# Patient Record
Sex: Female | Born: 1980
Health system: Southern US, Community
[De-identification: ages and names within clinical notes are randomized; demographics above are authoritative.]

## PROBLEM LIST (undated history)

## (undated) ENCOUNTER — Inpatient Hospital Stay (HOSPITAL_COMMUNITY): Payer: Self-pay

## (undated) DIAGNOSIS — IMO0002 Reserved for concepts with insufficient information to code with codable children: Secondary | ICD-10-CM

## (undated) DIAGNOSIS — R87629 Unspecified abnormal cytological findings in specimens from vagina: Secondary | ICD-10-CM

## (undated) DIAGNOSIS — O09299 Supervision of pregnancy with other poor reproductive or obstetric history, unspecified trimester: Secondary | ICD-10-CM

## (undated) DIAGNOSIS — R87619 Unspecified abnormal cytological findings in specimens from cervix uteri: Secondary | ICD-10-CM

## (undated) HISTORY — DX: Unspecified abnormal cytological findings in specimens from cervix uteri: R87.619

## (undated) HISTORY — PX: WISDOM TOOTH EXTRACTION: SHX21

## (undated) HISTORY — DX: Reserved for concepts with insufficient information to code with codable children: IMO0002

## (undated) HISTORY — DX: Unspecified abnormal cytological findings in specimens from vagina: R87.629

## (undated) HISTORY — PX: CRYOTHERAPY: SHX1416

---

## 2011-07-09 ENCOUNTER — Encounter: Payer: Self-pay | Admitting: Physician Assistant

## 2011-07-30 ENCOUNTER — Encounter: Payer: Self-pay | Admitting: Physician Assistant

## 2011-07-30 ENCOUNTER — Ambulatory Visit (INDEPENDENT_AMBULATORY_CARE_PROVIDER_SITE_OTHER): Payer: Self-pay | Admitting: Physician Assistant

## 2011-07-30 VITALS — BP 136/70 | HR 84 | Temp 98.0°F | Ht 61.0 in | Wt 186.0 lb

## 2011-07-30 DIAGNOSIS — Z30431 Encounter for routine checking of intrauterine contraceptive device: Secondary | ICD-10-CM

## 2011-07-30 MED ORDER — LORAZEPAM 1 MG PO TABS: ORAL_TABLET | ORAL | Status: DC

## 2011-07-30 MED ORDER — MISOPROSTOL 200 MCG PO TABS: ORAL_TABLET | ORAL | Status: DC

## 2011-07-30 NOTE — Patient Instructions (Signed)
Will check ultrasound for placement of IUD. If in the right spot, will plan for removal. Take cytotec in vagina night prior to removal and ativan tablet just before procedure for relaxation. Will need surgery if IUD is misplaced.

## 2011-07-30 NOTE — Progress Notes (Signed)
  Subjective:    Patient ID: Ashley Wilkerson, female    DOB: Apr 28, 1981, 31 y.o.   MRN: [id number]  HPI  Presents for IUD removal. Mirena placed 6 years ago. No pain or bleeding or other issues. Currently on OCPs for contraception. Two previous providers have not visualized strings. Last [place] was checked 2-3 years ago.   Review of Systems See HPI otherwise negative.    Objective:   Physical Exam  Constitutional: She is oriented to person, place, and time. She appears well-developed and well-nourished. No distress.  HENT:  Head: Normocephalic and atraumatic.  Genitourinary: Vagina normal and uterus normal.       Cervix visualized, no strings. Dilated and instrumented with curved kelleys, no return. Patient tolerated well. Procedure discontinued without visualization.  Musculoskeletal: She exhibits no edema.  Neurological: She is alert and oriented to person, place, and time. No cranial nerve deficit.  Psychiatric: She has a normal mood and affect.       Assessment & Plan:   IUD unable to be localized.  Will check [place] for placement. If normal, plan on pre-procedure cytotec and ativan for relaxation, with cervical block and removal. Discussed with patient if IUD is malpositioned may need operative removal.

## 2011-08-01 ENCOUNTER — Ambulatory Visit (HOSPITAL_COMMUNITY)
Admission: RE | Admit: 2011-08-01 | Discharge: 2011-08-01 | Disposition: A | Discharge status: Home / Self Care | Payer: Medicaid Other | Source: Ambulatory Visit | Attending: Obstetrics & Gynecology | Admitting: Obstetrics & Gynecology

## 2011-08-01 DIAGNOSIS — Z30431 Encounter for routine checking of intrauterine contraceptive device: Secondary | ICD-10-CM | POA: Insufficient documentation

## 2011-08-25 ENCOUNTER — Ambulatory Visit: Payer: Self-pay | Admitting: Physician Assistant

## 2014-04-03 ENCOUNTER — Encounter: Payer: Self-pay | Admitting: Physician Assistant

## 2014-09-21 ENCOUNTER — Other Ambulatory Visit (HOSPITAL_COMMUNITY)
Admission: RE | Admit: 2014-09-21 | Discharge: 2014-09-21 | Disposition: A | Discharge status: Home / Self Care | Payer: Medicaid Other | Source: Ambulatory Visit | Attending: Family Medicine | Admitting: Family Medicine

## 2014-09-21 DIAGNOSIS — Z1151 Encounter for screening for human papillomavirus (HPV): Secondary | ICD-10-CM | POA: Diagnosis present

## 2014-09-21 DIAGNOSIS — R87810 Cervical high risk human papillomavirus (HPV) DNA test positive: Secondary | ICD-10-CM | POA: Insufficient documentation

## 2014-09-22 ENCOUNTER — Other Ambulatory Visit (HOSPITAL_COMMUNITY)
Admission: RE | Admit: 2014-09-22 | Discharge: 2014-09-22 | Disposition: A | Discharge status: Home / Self Care | Payer: Medicaid Other | Source: Ambulatory Visit | Attending: Family Medicine | Admitting: Family Medicine

## 2014-09-22 DIAGNOSIS — Z01419 Encounter for gynecological examination (general) (routine) without abnormal findings: Secondary | ICD-10-CM | POA: Diagnosis not present

## 2014-09-22 DIAGNOSIS — Z1151 Encounter for screening for human papillomavirus (HPV): Secondary | ICD-10-CM | POA: Diagnosis present

## 2015-06-03 NOTE — L&D Delivery Note (Signed)
  After removal of cerclage cytotec 600 mcg buccal was given. At approximately 11:00 AM I was called to the room as infant's body had delivered. On exam the head remained inside the uterus and bleeding was minimal so delivery was expectant. At 11:47 I was called to the room by nursing with notification that the infant had delivered. Bleeding minimal. Cord clamped and cut and infant given to mother. Plan then for continued cytotec 400 mcg buccal q4 for management of 3rd stage. See future progress note for delivery of placenta.

## 2016-04-02 DIAGNOSIS — O09299 Supervision of pregnancy with other poor reproductive or obstetric history, unspecified trimester: Secondary | ICD-10-CM

## 2016-04-02 HISTORY — PX: CERVICAL CERCLAGE: SHX1329

## 2016-04-02 HISTORY — DX: Supervision of pregnancy with other poor reproductive or obstetric history, unspecified trimester: O09.299

## 2016-04-09 ENCOUNTER — Encounter: Payer: Self-pay | Admitting: Family Medicine

## 2016-04-09 ENCOUNTER — Ambulatory Visit (INDEPENDENT_AMBULATORY_CARE_PROVIDER_SITE_OTHER): Payer: Medicaid Other | Admitting: Family Medicine

## 2016-04-09 ENCOUNTER — Other Ambulatory Visit: Payer: Self-pay | Admitting: Family Medicine

## 2016-04-09 ENCOUNTER — Other Ambulatory Visit (HOSPITAL_COMMUNITY)
Admission: RE | Admit: 2016-04-09 | Discharge: 2016-04-09 | Disposition: A | Discharge status: Home / Self Care | Payer: Medicaid Other | Source: Ambulatory Visit | Attending: Family Medicine | Admitting: Family Medicine

## 2016-04-09 VITALS — BP 137/89 | HR 111 | Wt 197.0 lb

## 2016-04-09 DIAGNOSIS — O98312 Other infections with a predominantly sexual mode of transmission complicating pregnancy, second trimester: Secondary | ICD-10-CM | POA: Diagnosis not present

## 2016-04-09 DIAGNOSIS — O09529 Supervision of elderly multigravida, unspecified trimester: Secondary | ICD-10-CM | POA: Insufficient documentation

## 2016-04-09 DIAGNOSIS — O09522 Supervision of elderly multigravida, second trimester: Secondary | ICD-10-CM | POA: Diagnosis not present

## 2016-04-09 DIAGNOSIS — Z113 Encounter for screening for infections with a predominantly sexual mode of transmission: Secondary | ICD-10-CM

## 2016-04-09 DIAGNOSIS — A630 Anogenital (venereal) warts: Secondary | ICD-10-CM | POA: Insufficient documentation

## 2016-04-09 DIAGNOSIS — Z3689 Encounter for other specified antenatal screening: Secondary | ICD-10-CM

## 2016-04-09 DIAGNOSIS — Z1151 Encounter for screening for human papillomavirus (HPV): Secondary | ICD-10-CM | POA: Diagnosis present

## 2016-04-09 DIAGNOSIS — Z124 Encounter for screening for malignant neoplasm of cervix: Secondary | ICD-10-CM

## 2016-04-09 DIAGNOSIS — Z01419 Encounter for gynecological examination (general) (routine) without abnormal findings: Secondary | ICD-10-CM | POA: Insufficient documentation

## 2016-04-09 DIAGNOSIS — Z3480 Encounter for supervision of other normal pregnancy, unspecified trimester: Secondary | ICD-10-CM

## 2016-04-09 MED ORDER — IMIQUIMOD 5 % EX CREA: TOPICAL_CREAM | CUTANEOUS | 3 refills | Status: DC

## 2016-04-09 NOTE — Progress Notes (Signed)
Bedside ultrasound femur length 2.8 cm consistent with 18 weeks and 4 days. Fetal heart rate 168 bpm.Jennifer Howard RNBSN

## 2016-04-09 NOTE — Progress Notes (Signed)
  Subjective:    Ashley Wilkerson is a [id number] 5629w4d being seen today for her first obstetrical visit.  Her obstetrical history is significant for obesity. Patient does intend to breast feed. Pregnancy history fully reviewed.  Patient reports no complaints.  Vitals:   04/09/16 1544  BP: 137/89  Pulse: (!) 111  Weight: 197 lb (89.4 kg)    HISTORY: OB History  Gravida Para Term Preterm AB Living  3 2 2     2   SAB TAB Ectopic Multiple Live Births          1    # Outcome Date GA Lbr Len/2nd Weight Sex Delivery Anes PTL Lv  3 Current           2 Term 02/06/03 6164w0d  6 lb 4 oz (2.835 kg) F Vag-Spont  N LIV  1 Term 05/28/96 2767w0d  6 lb 8 oz (2.948 kg) M Vag-Spont  N      Past Medical History:  Diagnosis Date  . Abnormal Pap smear   . Vaginal Pap smear, abnormal    Past Surgical History:  Procedure Laterality Date  . cryo therapy    . CRYOTHERAPY     Family History  Problem Relation Age of Onset  . Hyperlipidemia Mother   . Hypertension Mother   . Cancer Neg Hx   . Birth defects Neg Hx      Exam    Uterus:  Fundal Height: 18 cm  Pelvic Exam:    Perineum: No Hemorrhoids, genital warts seen   Vulva: normal, Bartholin's, Urethra, Skene's normal   Vagina:  normal mucosa   Cervix: multiparous appearance  System:     Skin: normal coloration and turgor, no rashes    Neurologic: oriented, gait normal; reflexes normal and symmetric   Extremities: normal strength, tone, and muscle mass   HEENT PERRLA and extra ocular movement intact   Mouth/Teeth mucous membranes moist, pharynx normal without lesions   Neck supple and no masses   Cardiovascular: regular rate and rhythm, no murmurs or gallops   Respiratory:  appears well, vitals normal, no respiratory distress, acyanotic, normal RR, ear and throat exam is normal, neck free of mass or lymphadenopathy, chest clear, no wheezing, crepitations, rhonchi, normal symmetric air entry   Abdomen: soft, non-tender; bowel sounds  normal; no masses,  no organomegaly   Urinary: urethral meatus normal      Assessment:    Pregnancy: [id number] Patient Active Problem List   Diagnosis Date Noted  . Supervision of other normal pregnancy, antepartum 04/09/2016  . AMA (advanced maternal age) multigravida 35+, unspecified trimester 04/09/2016  . Maternal condyloma acuminata complicating pregnancy in second trimester 04/09/2016        Plan:     1. Supervision of other normal pregnancy, antepartum Discussed nature of practice. Harmony offered and ordered. US ordered.  100% of 30 minute visit spent in counseling. PNL ordered. - Cytology - PAP - Prenatal (OB Panel) - HIV antibody (with reflex) - CULTURE, URINE COMPREHENSIVE - Opiate, quantitative, urine - GC/Chlamydia probe amp ([facility] - US MFM OB COMP + 14 WK; Future  2. AMA (advanced maternal age) multigravida 35+, unspecified trimester Harmony offered - US MFM OB COMP + 14 WK; Future  3. Maternal condyloma acuminata complicating pregnancy in second trimester Aldara cream prescribed    Candelaria CelesteSTINSON, Zayne Marovich JEHIEL 04/09/2016

## 2016-04-10 LAB — OBSTETRIC PANEL
ANTIBODY SCREEN: NEGATIVE
BASOS PCT: 0 %
Basophils Absolute: 0 cells/uL (ref 0–200)
EOS PCT: 2 %
Eosinophils Absolute: 198 cells/uL (ref 15–500)
HEMATOCRIT: 42.6 % (ref 35.0–45.0)
HEMOGLOBIN: 13.9 g/dL (ref 11.7–15.5)
Hepatitis B Surface Ag: NEGATIVE
LYMPHS PCT: 24 %
Lymphs Abs: 2376 cells/uL (ref 850–3900)
MCH: 28.5 pg (ref 27.0–33.0)
MCHC: 32.6 g/dL (ref 32.0–36.0)
MCV: 87.5 fL (ref 80.0–100.0)
MONO ABS: 495 {cells}/uL (ref 200–950)
MPV: 11.4 fL (ref 7.5–12.5)
Monocytes Relative: 5 %
NEUTROS PCT: 69 %
Neutro Abs: 6831 cells/uL (ref 1500–7800)
PLATELETS: 221 10*3/uL (ref 140–400)
RBC: 4.87 MIL/uL (ref 3.80–5.10)
RDW: 14.3 % (ref 11.0–15.0)
RH TYPE: NEGATIVE
Rubella: 9.93 Index — ABNORMAL HIGH (ref <0.90)
WBC: 9.9 10*3/uL (ref 3.8–10.8)

## 2016-04-10 LAB — HIV ANTIBODY (ROUTINE TESTING W REFLEX): HIV: NONREACTIVE

## 2016-04-11 LAB — GC/CHLAMYDIA PROBE AMP (~~LOC~~) NOT AT ARMC
CHLAMYDIA, DNA PROBE: NEGATIVE
NEISSERIA GONORRHEA: NEGATIVE

## 2016-04-12 LAB — PAIN MGMT, OPIATES EXP. QN, UR
CODEINE: NEGATIVE ng/mL (ref <50)
Hydrocodone: NEGATIVE ng/mL (ref <50)
Hydromorphone: NEGATIVE ng/mL (ref <50)
Morphine: NEGATIVE ng/mL (ref <50)
Norhydrocodone: NEGATIVE ng/mL (ref <50)
Noroxycodone: NEGATIVE ng/mL (ref <50)
OXYCODONE: NEGATIVE ng/mL (ref <50)
OXYMORPHONE: NEGATIVE ng/mL (ref <50)

## 2016-04-12 LAB — CULTURE, URINE COMPREHENSIVE

## 2016-04-14 ENCOUNTER — Other Ambulatory Visit: Payer: Self-pay | Admitting: Family Medicine

## 2016-04-14 ENCOUNTER — Encounter: Payer: Self-pay | Admitting: Family Medicine

## 2016-04-14 ENCOUNTER — Ambulatory Visit (HOSPITAL_COMMUNITY)
Admission: RE | Admit: 2016-04-14 | Discharge: 2016-04-14 | Disposition: A | Discharge status: Home / Self Care | Payer: Medicaid Other | Source: Ambulatory Visit | Attending: Family Medicine | Admitting: Family Medicine

## 2016-04-14 DIAGNOSIS — O09529 Supervision of elderly multigravida, unspecified trimester: Secondary | ICD-10-CM

## 2016-04-14 DIAGNOSIS — Z3A19 19 weeks gestation of pregnancy: Secondary | ICD-10-CM | POA: Diagnosis not present

## 2016-04-14 DIAGNOSIS — Z3480 Encounter for supervision of other normal pregnancy, unspecified trimester: Secondary | ICD-10-CM

## 2016-04-14 DIAGNOSIS — O26899 Other specified pregnancy related conditions, unspecified trimester: Secondary | ICD-10-CM | POA: Insufficient documentation

## 2016-04-14 DIAGNOSIS — O09522 Supervision of elderly multigravida, second trimester: Secondary | ICD-10-CM | POA: Insufficient documentation

## 2016-04-14 DIAGNOSIS — Z363 Encounter for antenatal screening for malformations: Secondary | ICD-10-CM | POA: Diagnosis present

## 2016-04-14 DIAGNOSIS — Z6791 Unspecified blood type, Rh negative: Secondary | ICD-10-CM

## 2016-04-14 LAB — CYTOLOGY - PAP
DIAGNOSIS: NEGATIVE
HPV (WINDOPATH): DETECTED — AB

## 2016-04-16 ENCOUNTER — Encounter: Payer: Self-pay | Admitting: Family Medicine

## 2016-04-16 DIAGNOSIS — B977 Papillomavirus as the cause of diseases classified elsewhere: Secondary | ICD-10-CM | POA: Insufficient documentation

## 2016-04-17 ENCOUNTER — Encounter: Payer: Self-pay | Admitting: Family Medicine

## 2016-04-17 DIAGNOSIS — O3430 Maternal care for cervical incompetence, unspecified trimester: Secondary | ICD-10-CM | POA: Insufficient documentation

## 2016-04-20 ENCOUNTER — Encounter (HOSPITAL_COMMUNITY): Payer: Self-pay

## 2016-04-20 ENCOUNTER — Inpatient Hospital Stay (HOSPITAL_COMMUNITY)
Admission: AD | Admit: 2016-04-20 | Discharge: 2016-04-22 | DRG: 981 | Disposition: A | Discharge status: Home / Self Care | Payer: Medicaid Other | Source: Ambulatory Visit | Attending: Obstetrics & Gynecology | Admitting: Obstetrics & Gynecology

## 2016-04-20 ENCOUNTER — Other Ambulatory Visit: Payer: Self-pay | Admitting: Obstetrics and Gynecology

## 2016-04-20 ENCOUNTER — Inpatient Hospital Stay (HOSPITAL_COMMUNITY): Payer: Medicaid Other

## 2016-04-20 DIAGNOSIS — O42112 Preterm premature rupture of membranes, onset of labor more than 24 hours following rupture, second trimester: Secondary | ICD-10-CM | POA: Diagnosis not present

## 2016-04-20 DIAGNOSIS — O42912 Preterm premature rupture of membranes, unspecified as to length of time between rupture and onset of labor, second trimester: Secondary | ICD-10-CM | POA: Diagnosis present

## 2016-04-20 DIAGNOSIS — O42919 Preterm premature rupture of membranes, unspecified as to length of time between rupture and onset of labor, unspecified trimester: Secondary | ICD-10-CM | POA: Diagnosis not present

## 2016-04-20 DIAGNOSIS — Z8249 Family history of ischemic heart disease and other diseases of the circulatory system: Secondary | ICD-10-CM

## 2016-04-20 DIAGNOSIS — O3432 Maternal care for cervical incompetence, second trimester: Secondary | ICD-10-CM | POA: Diagnosis present

## 2016-04-20 DIAGNOSIS — Z3A20 20 weeks gestation of pregnancy: Secondary | ICD-10-CM

## 2016-04-20 LAB — WET PREP, GENITAL
Clue Cells Wet Prep HPF POC: NONE SEEN
SPERM: NONE SEEN
Trich, Wet Prep: NONE SEEN
Yeast Wet Prep HPF POC: NONE SEEN

## 2016-04-20 LAB — ABO/RH: ABO/RH(D): O NEG

## 2016-04-20 LAB — TYPE AND SCREEN
ABO/RH(D): O NEG
Antibody Screen: NEGATIVE

## 2016-04-20 LAB — URINALYSIS, ROUTINE W REFLEX MICROSCOPIC
Bilirubin Urine: NEGATIVE
Glucose, UA: NEGATIVE mg/dL
Ketones, ur: NEGATIVE mg/dL
Nitrite: NEGATIVE
PROTEIN: NEGATIVE mg/dL
Specific Gravity, Urine: 1.01 (ref 1.005–1.030)
pH: 8.5 — ABNORMAL HIGH (ref 5.0–8.0)

## 2016-04-20 LAB — CBC
HCT: 38.4 % (ref 36.0–46.0)
Hemoglobin: 13.1 g/dL (ref 12.0–15.0)
MCH: 28.5 pg (ref 26.0–34.0)
MCHC: 34.1 g/dL (ref 30.0–36.0)
MCV: 83.5 fL (ref 78.0–100.0)
PLATELETS: 202 10*3/uL (ref 150–400)
RBC: 4.6 MIL/uL (ref 3.87–5.11)
RDW: 13.6 % (ref 11.5–15.5)
WBC: 11.7 10*3/uL — AB (ref 4.0–10.5)

## 2016-04-20 LAB — URINE MICROSCOPIC-ADD ON: BACTERIA UA: NONE SEEN

## 2016-04-20 LAB — AMNISURE RUPTURE OF MEMBRANE (ROM) NOT AT ARMC: Amnisure ROM: POSITIVE

## 2016-04-20 MED ORDER — CALCIUM CARBONATE ANTACID 500 MG PO CHEW
2.0000 | CHEWABLE_TABLET | ORAL | Status: DC | PRN
  Administered 2016-04-21: 400 mg via ORAL
  Filled 2016-04-20: qty 2

## 2016-04-20 MED ORDER — LACTATED RINGERS IV SOLN
INTRAVENOUS | Status: DC
  Administered 2016-04-20 – 2016-04-22 (×3): via INTRAVENOUS

## 2016-04-20 MED ORDER — DOCUSATE SODIUM 100 MG PO CAPS
100.0000 mg | ORAL_CAPSULE | Freq: Every day | ORAL | Status: DC
  Administered 2016-04-21: 100 mg via ORAL
  Filled 2016-04-20: qty 1

## 2016-04-20 MED ORDER — ACETAMINOPHEN 325 MG PO TABS
650.0000 mg | ORAL_TABLET | ORAL | Status: DC | PRN
  Administered 2016-04-22: 650 mg via ORAL
  Filled 2016-04-20: qty 2

## 2016-04-20 MED ORDER — PRENATAL MULTIVITAMIN CH
1.0000 | ORAL_TABLET | Freq: Every day | ORAL | Status: DC
  Administered 2016-04-21: 1 via ORAL
  Filled 2016-04-20: qty 1

## 2016-04-20 MED ORDER — AZITHROMYCIN 250 MG PO TABS
1000.0000 mg | ORAL_TABLET | Freq: Once | ORAL | Status: AC
  Administered 2016-04-20: 1000 mg via ORAL
  Filled 2016-04-20: qty 4

## 2016-04-20 MED ORDER — HYDROMORPHONE HCL 1 MG/ML IJ SOLN
1.0000 mg | Freq: Once | INTRAMUSCULAR | Status: DC
  Filled 2016-04-20: qty 1

## 2016-04-20 MED ORDER — SODIUM CHLORIDE 0.9 % IV SOLN
2.0000 g | Freq: Four times a day (QID) | INTRAVENOUS | Status: DC
  Administered 2016-04-20 – 2016-04-22 (×7): 2 g via INTRAVENOUS
  Filled 2016-04-20 (×9): qty 2000

## 2016-04-20 MED ORDER — LORAZEPAM 2 MG/ML IJ SOLN
1.0000 mg | Freq: Once | INTRAMUSCULAR | Status: DC
  Filled 2016-04-20: qty 0.5

## 2016-04-20 MED ORDER — ZOLPIDEM TARTRATE 5 MG PO TABS: 5.0000 mg | ORAL_TABLET | Freq: Every evening | ORAL | Status: DC | PRN

## 2016-04-20 NOTE — MAU Note (Signed)
Pt presents with "something is coming out of her vagina". Pt states she had a [facility] ago [facility]. Pt denies any pain.

## 2016-04-20 NOTE — H&P (Signed)
History   [name] is a 35 yo G3P2002 @ 20.1 wks who presents with vaginal pressure after using bathroom this afternoon. Pt states she looked " with a mirror and saw a bulge." Denies any cramping or low back pain.  + FM       Chief Complaint  Patient presents with  . Abdominal Pain   HPI          OB History    Gravida Para Term Preterm AB Living   3 2 2     2    SAB TAB Ectopic Multiple Live Births           1          Past Medical History:  Diagnosis Date  . Abnormal Pap smear   . Vaginal Pap smear, abnormal          Past Surgical History:  Procedure Laterality Date  . cryo therapy    . CRYOTHERAPY           Family History  Problem Relation Age of Onset  . Hyperlipidemia Mother   . Hypertension Mother   . Cancer Neg Hx   . Birth defects Neg Hx         Social History  Substance Use Topics  . Smoking status: Never Smoker  . Smokeless tobacco: Never Used  . Alcohol use No    Allergies: No Known Allergies         Prescriptions Prior to Admission  Medication Sig Dispense Refill Last Dose  . imiquimod (ALDARA) 5 % cream Apply topically 3 (three) times a week. 12 each 3     Review of Systems  Constitutional: Negative.   HENT: Negative.   Eyes: Negative.   Respiratory: Negative.   Cardiovascular: Negative.   Gastrointestinal: Negative.   Genitourinary: Negative.   Musculoskeletal: Negative.   Skin: Negative.   Neurological: Negative.   Endo/Heme/Allergies: Negative.   Psychiatric/Behavioral: Negative.    Physical Exam   Blood pressure 136/76, pulse 107, temperature 98.1 F (36.7 C), temperature source Oral, resp. rate 18, last menstrual period 12/01/2015.  Physical Exam  Constitutional: She is oriented to person, place, and time. She appears well-developed and well-nourished.  HENT:  Head: Normocephalic and atraumatic.  Eyes: Conjunctivae are normal. Pupils are equal, round, and reactive to light.   Neck: Normal range of motion. Neck supple.  Cardiovascular: Normal rate and regular rhythm.   Respiratory: Effort normal and breath sounds normal.  GI: Soft. Bowel sounds are normal.  Genitourinary: Vaginal discharge found.  Genitourinary Comments: Mucous and bloody discharge noted on labia; upon internal examination membranes in vaginal vault   Musculoskeletal: Normal range of motion.  Neurological: She is alert and oriented to person, place, and time. She has normal reflexes.  Skin: Skin is warm and dry.  Psychiatric: She has a normal mood and affect. Her behavior is normal. Judgment and thought content normal.       MAU Course  Procedures     Assessment and Plan  1. Rupture of membranes @ 20.1 wks  2. Incompetent cervix  3. IUP @ 20.1 wks  4. AMA  Plan: Will obtain wet prep, GC/CMZ, and amnisure +; Will obtain u/s to confirm presentation, AFI, and possible cervical length; place pt in trendelenburg position; will consult w/ Dr. Penne LashLeggett about POC  Ashley Wilkerson 04/20/2016, 12:57 PM   Pt seen and examined.  Bulging bag approx 3 cm from the introitus.  US shows stitch still intact.  Amniosure  positive so ?high leak.   Discussed case with dr. Otho PerlNitsche.  Offered patient stitch removal and delivery due to risk of maternal infection and morbidity.  Pt can also elect to have leave stitch in place and monitor for signs of infection or labor.    At this point, the patient elects to keep stitch in place. Will start latency antibiotics and NICU consult.    Electronically signed by Lesly DukesKelly H Leggett, MD at 04/20/2016 4:21 PM

## 2016-04-20 NOTE — MAU Provider Note (Signed)
History   Ms. Barnabas ListerWashington is a 35 yo G3P2002 @ 20.1 wks who presents with vaginal pressure after using bathroom this afternoon. Pt states she looked " with a mirror and saw a bulge." Denies any cramping or low back pain.  + FM   CSN: [id number]  Arrival date and time: 04/20/16 1221   None     Chief Complaint  Patient presents with  . Abdominal Pain   HPI  OB History    Gravida Para Term Preterm AB Living   3 2 2     2    SAB TAB Ectopic Multiple Live Births           1      Past Medical History:  Diagnosis Date  . Abnormal Pap smear   . Vaginal Pap smear, abnormal     Past Surgical History:  Procedure Laterality Date  . cryo therapy    . CRYOTHERAPY      Family History  Problem Relation Age of Onset  . Hyperlipidemia Mother   . Hypertension Mother   . Cancer Neg Hx   . Birth defects Neg Hx     Social History  Substance Use Topics  . Smoking status: Never Smoker  . Smokeless tobacco: Never Used  . Alcohol use No    Allergies: No Known Allergies  Prescriptions Prior to Admission  Medication Sig Dispense Refill Last Dose  . imiquimod (ALDARA) 5 % cream Apply topically 3 (three) times a week. 12 each 3     Review of Systems  Constitutional: Negative.   HENT: Negative.   Eyes: Negative.   Respiratory: Negative.   Cardiovascular: Negative.   Gastrointestinal: Negative.   Genitourinary: Negative.   Musculoskeletal: Negative.   Skin: Negative.   Neurological: Negative.   Endo/Heme/Allergies: Negative.   Psychiatric/Behavioral: Negative.    Physical Exam   Blood pressure 136/76, pulse 107, temperature 98.1 F (36.7 C), temperature source Oral, resp. rate 18, last menstrual period 12/01/2015.  Physical Exam  Constitutional: She is oriented to person, place, and time. She appears well-developed and well-nourished.  HENT:  Head: Normocephalic and atraumatic.  Eyes: Conjunctivae are normal. Pupils are equal, round, and reactive to light.  Neck:  Normal range of motion. Neck supple.  Cardiovascular: Normal rate and regular rhythm.   Respiratory: Effort normal and breath sounds normal.  GI: Soft. Bowel sounds are normal.  Genitourinary: Vaginal discharge found.  Genitourinary Comments: Mucous and bloody discharge noted on labia; upon internal examination membranes in vaginal vault   Musculoskeletal: Normal range of motion.  Neurological: She is alert and oriented to person, place, and time. She has normal reflexes.  Skin: Skin is warm and dry.  Psychiatric: She has a normal mood and affect. Her behavior is normal. Judgment and thought content normal.       MAU Course  Procedures     Assessment and Plan  1. Rupture of membranes @ 20.1 wks  2. Incompetent cervix  3. IUP @ 20.1 wks  4. AMA  Plan: Will obtain wet prep, GC/CMZ, and amnisure +; Will obtain u/s to confirm presentation, AFI, and possible cervical length; place pt in trendelenburg position; will consult w/ Dr. Penne LashLeggett about POC  Wyvonnia DuskyMarie Lawson 04/20/2016, 12:57 PM   Pt seen and examined.  Bulging bag approx 3 cm from the introitus.  US shows stitch still intact.  Amniosure positive so ?high leak.   Discussed case with dr. Otho PerlNitsche.  Offered patient stitch removal and delivery due to risk  of maternal infection and morbidity.  Pt can also elect to have leave stitch in place and monitor for signs of infection or labor.    At this point, the patient elects to keep stitch in place. Will start latency antibiotics and NICU consult.

## 2016-04-20 NOTE — Progress Notes (Signed)
RN spoke with pt at shift change, and pt stated now indecisive about clipping cerclage and possible outcomes. MD notified. Dr. Shawnie PonsPratt at bedside, discussing options for mom and fetus at this point. Options, risks and benefits for each discussed. Pt states better understanding of options. Pt denies questions/ concerns at this time.

## 2016-04-21 ENCOUNTER — Encounter: Payer: Self-pay | Admitting: Obstetrics and Gynecology

## 2016-04-21 LAB — CERVICOVAGINAL ANCILLARY ONLY
CHLAMYDIA, DNA PROBE: NEGATIVE
NEISSERIA GONORRHEA: NEGATIVE

## 2016-04-21 NOTE — Progress Notes (Signed)
Patient ID: Ashley Wilkerson, female   DOB: 30-Jan-1981, 35 y.o.   MRN: [id number] FACULTY PRACTICE ANTEPARTUM(COMPREHENSIVE) NOTE  Ashley Wilkerson is a 35 y.o. G3P2002 at 4092w2d by midtrimester ultrasound who is admitted for incompetent cervix with probable ROM.   Fetal presentation is cephalic. Length of Stay:  1  Days  Subjective: Still leaking fluid Patient reports the fetal movement as active. Patient reports uterine contraction  activity as none. Patient reports  vaginal bleeding as none. Patient describes fluid per vagina as Clear.  Vitals:  Blood pressure 103/62, pulse 84, temperature 98.3 F (36.8 C), temperature source Oral, resp. rate 18, height 5' (1.524 m), weight 196 lb (88.9 kg), last menstrual period 12/01/2015, SpO2 99 %. Physical Examination:  General appearance - alert, well appearing, and in no distress Chest - normal effort Abdomen - gravid, NT Fundal Height:  size equals dates Extremities: Homans sign is negative, no sign of DVT  Membranes:intact +FHR by doppler  Labs:  Results for orders placed or performed during the hospital encounter of 04/20/16 (from the past 24 hour(s))  Urinalysis, Routine w reflex microscopic (not at [facility])   Collection Time: 04/20/16 12:27 PM  Result Value Ref Range   Color, Urine YELLOW YELLOW   APPearance CLEAR CLEAR   Specific Gravity, Urine 1.010 1.005 - 1.030   pH 8.5 (H) 5.0 - 8.0   Glucose, UA NEGATIVE NEGATIVE mg/dL   Hgb urine dipstick MODERATE (A) NEGATIVE   Bilirubin Urine NEGATIVE NEGATIVE   Ketones, ur NEGATIVE NEGATIVE mg/dL   Protein, ur NEGATIVE NEGATIVE mg/dL   Nitrite NEGATIVE NEGATIVE   Leukocytes, UA MODERATE (A) NEGATIVE  Urine microscopic-add on   Collection Time: 04/20/16 12:27 PM  Result Value Ref Range   Squamous Epithelial / LPF 0-5 (A) NONE SEEN   WBC, UA 6-30 0 - 5 WBC/hpf   RBC / HPF 6-30 0 - 5 RBC/hpf   Bacteria, UA NONE SEEN NONE SEEN  ABO/Rh   Collection Time: 04/20/16 12:48 PM  Result  Value Ref Range   ABO/RH(D) O NEG   Wet prep, genital   Collection Time: 04/20/16 12:59 PM  Result Value Ref Range   Yeast Wet Prep HPF POC NONE SEEN NONE SEEN   Trich, Wet Prep NONE SEEN NONE SEEN   Clue Cells Wet Prep HPF POC NONE SEEN NONE SEEN   WBC, Wet Prep HPF POC FEW (A) NONE SEEN   Sperm NONE SEEN   Amnisure rupture [facility] ([facility]   Collection Time: 04/20/16  1:29 PM  Result Value Ref Range   Amnisure ROM POSITIVE   CBC on admission   Collection Time: 04/20/16  3:15 PM  Result Value Ref Range   WBC 11.7 (H) 4.0 - 10.5 K/uL   RBC 4.60 3.87 - 5.11 MIL/uL   Hemoglobin 13.1 12.0 - 15.0 g/dL   HCT 13.038.4 86.536.0 - 78.446.0 %   MCV 83.5 78.0 - 100.0 fL   MCH 28.5 26.0 - 34.0 pg   MCHC 34.1 30.0 - 36.0 g/dL   RDW 69.613.6 29.511.5 - 28.415.5 %   Platelets 202 150 - 400 K/uL  Type and screen [facility]   Collection Time: 04/20/16  3:15 PM  Result Value Ref Range   ABO/RH(D) O NEG    Antibody Screen NEG    Sample Expiration 04/23/2016    Medications:  Scheduled . ampicillin (OMNIPEN) IV  2 g Intravenous Q6H  . docusate sodium  100 mg Oral Daily  .  HYDROmorphone (DILAUDID) injection  1 mg Intravenous Once  . LORazepam  1 mg Intravenous Once  . prenatal multivitamin  1 tablet Oral Q1200   I have reviewed the patient's current medications.  ASSESSMENT: Active Problems:   Preterm premature rupture of membranes (PPROM) with unknown onset of labor   PLAN: Delivery with s/sx's of infection Cerclage remains in place  Reva Boresanya S Pratt, MD 04/21/2016,7:26 AM

## 2016-04-22 ENCOUNTER — Encounter (HOSPITAL_COMMUNITY): Payer: Self-pay

## 2016-04-22 ENCOUNTER — Ambulatory Visit (HOSPITAL_COMMUNITY)
Admission: RE | Admit: 2016-04-22 | Payer: Medicaid Other | Source: Ambulatory Visit | Attending: Obstetrics and Gynecology | Admitting: Obstetrics and Gynecology

## 2016-04-22 DIAGNOSIS — O42112 Preterm premature rupture of membranes, onset of labor more than 24 hours following rupture, second trimester: Secondary | ICD-10-CM

## 2016-04-22 DIAGNOSIS — Z3A20 20 weeks gestation of pregnancy: Secondary | ICD-10-CM

## 2016-04-22 DIAGNOSIS — O3432 Maternal care for cervical incompetence, second trimester: Secondary | ICD-10-CM

## 2016-04-22 MED ORDER — MISOPROSTOL 200 MCG PO TABS
600.0000 ug | ORAL_TABLET | Freq: Once | ORAL | Status: AC
  Administered 2016-04-22: 600 ug via BUCCAL
  Filled 2016-04-22: qty 3

## 2016-04-22 MED ORDER — RHO D IMMUNE GLOBULIN 1500 UNIT/2ML IJ SOSY
300.0000 ug | PREFILLED_SYRINGE | Freq: Once | INTRAMUSCULAR | Status: AC
  Administered 2016-04-22: 300 ug via INTRAMUSCULAR
  Filled 2016-04-22: qty 2

## 2016-04-22 MED ORDER — ACETAMINOPHEN 325 MG PO TABS: 650.0000 mg | ORAL_TABLET | ORAL | 1 refills | Status: DC | PRN

## 2016-04-22 MED ORDER — MISOPROSTOL 200 MCG PO TABS
400.0000 ug | ORAL_TABLET | ORAL | Status: DC
  Administered 2016-04-22: 400 ug via BUCCAL
  Filled 2016-04-22: qty 2

## 2016-04-22 MED ORDER — IBUPROFEN 600 MG PO TABS: 600.0000 mg | ORAL_TABLET | Freq: Four times a day (QID) | ORAL | Status: DC | PRN

## 2016-04-22 MED ORDER — IBUPROFEN 600 MG PO TABS: 600.0000 mg | ORAL_TABLET | Freq: Four times a day (QID) | ORAL | 0 refills | Status: DC | PRN

## 2016-04-22 MED ORDER — AZITHROMYCIN 500 MG PO TABS
1000.0000 mg | ORAL_TABLET | Freq: Once | ORAL | Status: AC
  Administered 2016-04-22: 1000 mg via ORAL
  Filled 2016-04-22: qty 2

## 2016-04-22 NOTE — Progress Notes (Signed)
I met pt and family briefly this morning before the cerclage was removed.  They did not wish to speak at that time.  I checked in with her nurse later and she told me that they would let me know if they wanted to speak more.  I did see pt's mother, Army Melia, as she was leaving the room and offered her grief support which she was appreciative of.    Please page as needs arise.  [place], [place] Pager, [phone] 12:28 PM

## 2016-04-22 NOTE — Progress Notes (Signed)
Ashley Wilkerson is a 35 y.o. [id number] at 5537w3d admitted for PPROM and cervical incompetence. PPROM on 11/19, cerclage in place.  Patient had large amount of fluid per vagina today, has decided to proceed with cerclage removal and induction of labor due to previable PPROM.  Subjective: Patient is appropriately emotional.  Supported by her mother and FOB.  Declines to speak to Chaplain for now.  Objective: BP 117/79   Pulse 100   Temp 98.6 F (37 C) (Oral)   Resp 18   Ht 5' (1.524 m)   Wt 196 lb (88.9 kg)   LMP 12/01/2015   SpO2 98%   BMI 38.28 kg/m  I/O last 3 completed shifts: In: 1420 [P.O.:720; I.V.:600; IV Piggyback:100] Out: 2125 [Urine:2125] No intake/output data recorded. Gen: NAD Abd: NT, gravid UC:   irregular SSE/Cerclage removal:  Umbilical cord noted at introitus. On speculum exam, rest of cord and fetal foot noted to be coming out of cervix. Cervix visually 2-3 cm dilated.  Cerclage knot noted anteriorly; knot was grasped and one of the ends under the knot was cut. Cerclage was then removed intact. There was some bleeding after cerclage removal.     Labs: Lab Results  Component Value Date   WBC 11.7 (H) 04/20/2016   HGB 13.1 04/20/2016   HCT 38.4 04/20/2016   MCV 83.5 04/20/2016   PLT 202 04/20/2016    Assessment / Plan: Induction of labor due to PPROM. Will proceed with Cytotec 600 mcg then 400 mcg q 4h as needed Patient was informed of possible need for D&E for retained placenta or hemorrhage, will limit intake to clears for now.  Appropriate support given to patient and her family.   Tereso NewcomerANYANWU,Garwood Wentzell A, MD 04/22/2016, 9:43 AM

## 2016-04-22 NOTE — Lactation Note (Signed)
Lactation Consultation Note  Patient Name: Ashley Wilkerson [id number] Date: 04/22/2016   I met w/Mom and provided her w/the Breast Care After Loss brochure. We discussed cabbage leaves, sage tea & other comfort measures. Hand pump provided so that Mom can express if she begins to become uncomfortably full. Mom understands that the pump is not to be used to empty her breasts, but to express some milk for her comfort. Mom was shown how to use the hand pump; on visual inspection a size 24 flange is appropriate for her.    S/sx of mastitis discussed. Mom knows how to get in touch w/Lactation if she has further questions once she gets home.  Matthias Hughs [facility] 04/22/2016, 6:28 PM

## 2016-04-22 NOTE — Progress Notes (Signed)
I spent some time with pt's cousin who was sitting outside her room in the hallway.  I offered her a more private area where she and family could wait if needed.  I also gave her information for [employer], for Shequila's 35 year old daughter, that she could share with Merina at the right time.  She asked me some preliminary questions about making arrangements and I let her know that we would provide Alveta a list of funeral homes in a little while and gave her some information on how a family goes about making those arrangements.      Chaplain Dyanne CarrelKaty Jolie Strohecker, [place] Pager, [phone] 1:01 PM    04/22/16 1200  Clinical Encounter Type  Visited With Family;Patient and family together  Visit Type Initial;Spiritual support  Referral From Nurse;Physician  Spiritual Encounters  Spiritual Needs Grief support

## 2016-04-22 NOTE — Progress Notes (Signed)
Faculty Practice OB/GYN Attending Note   Subjective:  Called to evaluate patient with temperature of 100.3, retained placenta after delivery of nonviable fetus at 1147.  Patient has minimal bleeding, periodic cramping.   Admitted on 04/20/2016 for previable PPROM.      Objective:  Blood pressure 117/79, pulse 100, temperature 100.3 F (37.9 C), temperature source Oral, resp. rate 18, height 5' (1.524 m), weight 196 lb (88.9 kg), last menstrual period 12/01/2015, SpO2 98 %. Gen: NAD HENT: Normocephalic, atraumatic Lungs: Normal respiratory effort Heart: Regular rate noted Abdomen: NT, soft Ext: 2+ DTRs, no edema, no cyanosis, negative Homan's sign Cervix: Bulk of placenta palpated in vagina. Patient instructed to push once, and placenta delivered intact. Minimal bleeding noted after delivery.  Placenta sent to pathology.   Assessment & Plan:  35 y.o. [id number] s/p preterm delivery of previable 2960w3d fetus after PPROM.  Placenta has now delivered, patient has low grade temperature. Azithromycin 1000 mg po x 1 dose and Ibuprofen 600 mg given. Will continue close observation. If she remains afebrile and bleeding is still minimal and has no other concerning symptoms, she may be discharged later today. She was also given the option of going home tomorrow.   Jaynie CollinsUGONNA  Jaramie Bastos, MD, FACOG Attending Obstetrician & Gynecologist Faculty Practice, [facility]

## 2016-04-22 NOTE — Progress Notes (Signed)
Ashley Wilkerson is a 35 y.o. [id number] at 7714w3d admitted for rupture of membranes  Subjective: Discussed option with patient - had bigger gush of fluid last night. Patient would like cerclage removed and actively move to deliver the baby.  Objective: BP 117/79   Pulse 100   Temp 97.9 F (36.6 C) (Oral)   Resp 18   Ht 5' (1.524 m)   Wt 196 lb (88.9 kg)   LMP 12/01/2015   SpO2 98%   BMI 38.28 kg/m  I/O last 3 completed shifts: In: 1420 [P.O.:720; I.V.:600; IV Piggyback:100] Out: 2125 [Urine:2125] No intake/output data recorded.  Gen: A&Ox3. NAD Abd: soft, nontender. Ext: No edema.  Labs: Lab Results  Component Value Date   WBC 11.7 (H) 04/20/2016   HGB 13.1 04/20/2016   HCT 38.4 04/20/2016   MCV 83.5 04/20/2016   PLT 202 04/20/2016    Assessment / Plan: Due to risks of maternal infection and prognosis of pregnancy, I believe that the patient should be delivered emergently. Will move pt to birthing suite and start induction of labor with cytotec.   Alyene Predmore JEHIEL 04/22/2016, 7:10 AM

## 2016-04-22 NOTE — Discharge Summary (Signed)
OB Discharge Summary     Patient Name: Ashley Wilkerson DOB: 11/03/1980 MRN: [id number]  Date of admission: 04/20/2016 Delivering MD: Devra DoppKNOX, AMBER M   Date of discharge: 04/22/2016  Admitting diagnosis: 20 WKS, FEELS LIKE SOMETHING COMING OUT Intrauterine pregnancy: 7714w3d     Secondary diagnosis:  Cervical insufficiency, mid-trimester pprom, pregnancy termination Additional problems: none     Discharge diagnosis: delivery of non-viable infant after pre-viable pprom                                                                                                Antepartum procedures: cerclage removal  Postpartum procedures: Rhogam  Augmentation: Cytotec  [facility]  Hospital course:  The patient presented 11/19 with PPROM in the setting of known cervical insufficiency with vaginal cervical cerclage in place. The patient initially opted for expectant management. Betamethasone was given, and latency antibiotics started. On 11/21 the patient opted to have the cerclage removed and to proceed with pregnancy termination. Prolapsed cord and infant fetal feet noticed at time of cerclage removal. Buccal cytotec was administered. The patient proceeded to delivery of non-viable infant within approximately 2 hours, and the placenta passed approximately 3 hours later with gentle manual traction. The patient developed temperature of 100.3 around the time of delivery and was given azithromycin 1000 mg once. At the time of discharge she is afebrile, bleeding minimal, no nausea or vomiting, and with minimal uterine tenderness. The patient declined autopsy.  Physical exam  Vitals:   04/22/16 1639 04/22/16 1724 04/22/16 1800 04/22/16 1850  BP:   101/70   Pulse:   98   Resp:   18   Temp: 99.9 F (37.7 C) 99.5 F (37.5 C)  98.5 F (36.9 C)  TempSrc: Oral Oral  Oral  SpO2:      Weight:      Height:       General: alert, cooperative and no distress Lochia: appropriate Uterine  Fundus: firm Incision: N/A DVT Evaluation: No evidence of DVT seen on physical exam. Labs: Lab Results  Component Value Date   WBC 11.7 (H) 04/20/2016   HGB 13.1 04/20/2016   HCT 38.4 04/20/2016   MCV 83.5 04/20/2016   PLT 202 04/20/2016   No flowsheet data found.  Discharge instruction: per After Visit Summary and "Baby and Me Booklet".  After visit meds:    Medication List    STOP taking these medications   prenatal multivitamin Tabs tablet     TAKE these medications   acetaminophen 325 MG tablet Commonly known as:  TYLENOL Take 2 tablets (650 mg total) by mouth every 4 (four) hours as needed (for pain scale < 4  OR  temperature  >/=  100.5 F).   ibuprofen 600 MG tablet Commonly known as:  ADVIL,MOTRIN Take 1 tablet (600 mg total) by mouth every 6 (six) hours as needed for fever or headache.       Diet: carb modified diet  Activity: Advance as tolerated. Pelvic rest for 6 weeks.   Outpatient follow up:6 weeks Follow up Appt:Future Appointments Date Time Provider Department Center  05/07/2016 3:15 PM Levie HeritageJacob J Stinson, DO [facility] None   Follow up Visit:No Follow-up on file.  Postpartum contraception: undecided  Newborn Data: Stillborn female  Birth Weight: 0 oz (0 g) APGAR: 0, 0    04/22/2016 Silvano BilisNoah B Deshannon Seide, MD

## 2016-04-22 NOTE — Lactation Note (Signed)
Lactation Consultation Note  Patient Name: Rosaland LaoShareka Washington JXBJY'NToday's Date: 04/22/2016  Opened in error.  Lurline HareRichey, [place] [facility] 04/22/2016, 6:27 PM

## 2016-04-23 LAB — RH IG WORKUP (INCLUDES ABO/RH)
ABO/RH(D): O NEG
Antibody Screen: NEGATIVE
Fetal Screen: NEGATIVE
GESTATIONAL AGE(WKS): 20
UNIT DIVISION: 0

## 2016-04-23 NOTE — Progress Notes (Signed)
D/C paperwork reviewed with patient. No questions at this time. VS stable, no c/o pain. Patient escorted to vehicle via wheelchair with RN and mother.

## 2016-05-07 ENCOUNTER — Encounter: Payer: Medicaid Other | Admitting: Family Medicine

## 2017-02-08 ENCOUNTER — Encounter: Payer: Self-pay | Admitting: *Deleted

## 2017-02-08 DIAGNOSIS — O0990 Supervision of high risk pregnancy, unspecified, unspecified trimester: Secondary | ICD-10-CM | POA: Insufficient documentation

## 2017-02-10 ENCOUNTER — Encounter: Payer: Self-pay | Admitting: Obstetrics and Gynecology

## 2017-02-10 ENCOUNTER — Other Ambulatory Visit (HOSPITAL_COMMUNITY)
Admission: RE | Admit: 2017-02-10 | Discharge: 2017-02-10 | Disposition: A | Discharge status: Home / Self Care | Payer: Medicaid Other | Source: Ambulatory Visit | Attending: Obstetrics and Gynecology | Admitting: Obstetrics and Gynecology

## 2017-02-10 ENCOUNTER — Ambulatory Visit (INDEPENDENT_AMBULATORY_CARE_PROVIDER_SITE_OTHER): Payer: Medicaid Other | Admitting: Obstetrics and Gynecology

## 2017-02-10 VITALS — BP 118/78 | HR 72 | Temp 98.5°F | Wt 210.4 lb

## 2017-02-10 DIAGNOSIS — O26899 Other specified pregnancy related conditions, unspecified trimester: Secondary | ICD-10-CM

## 2017-02-10 DIAGNOSIS — O99211 Obesity complicating pregnancy, first trimester: Secondary | ICD-10-CM

## 2017-02-10 DIAGNOSIS — O09299 Supervision of pregnancy with other poor reproductive or obstetric history, unspecified trimester: Secondary | ICD-10-CM

## 2017-02-10 DIAGNOSIS — O99210 Obesity complicating pregnancy, unspecified trimester: Secondary | ICD-10-CM

## 2017-02-10 DIAGNOSIS — Z3689 Encounter for other specified antenatal screening: Secondary | ICD-10-CM

## 2017-02-10 DIAGNOSIS — Z3A00 Weeks of gestation of pregnancy not specified: Secondary | ICD-10-CM | POA: Diagnosis not present

## 2017-02-10 DIAGNOSIS — E669 Obesity, unspecified: Secondary | ICD-10-CM

## 2017-02-10 DIAGNOSIS — Z6791 Unspecified blood type, Rh negative: Secondary | ICD-10-CM

## 2017-02-10 DIAGNOSIS — O09891 Supervision of other high risk pregnancies, first trimester: Secondary | ICD-10-CM

## 2017-02-10 DIAGNOSIS — O0990 Supervision of high risk pregnancy, unspecified, unspecified trimester: Secondary | ICD-10-CM | POA: Diagnosis not present

## 2017-02-10 DIAGNOSIS — O09521 Supervision of elderly multigravida, first trimester: Secondary | ICD-10-CM

## 2017-02-10 DIAGNOSIS — O09899 Supervision of other high risk pregnancies, unspecified trimester: Secondary | ICD-10-CM

## 2017-02-10 DIAGNOSIS — O09291 Supervision of pregnancy with other poor reproductive or obstetric history, first trimester: Secondary | ICD-10-CM

## 2017-02-10 DIAGNOSIS — O09529 Supervision of elderly multigravida, unspecified trimester: Secondary | ICD-10-CM

## 2017-02-10 LAB — OB RESULTS CONSOLE GC/CHLAMYDIA: GC PROBE AMP, GENITAL: NEGATIVE

## 2017-02-10 NOTE — Progress Notes (Signed)
New OB Note  02/10/2017   [facility]  Chief Complaint: NOB  Transfer of Care Patient: no  History of Present Illness: [name] is a 36 y.o. [id number] @ 8/0 weeks (EDC 4/23, based on 8wk u/s). Patient's last menstrual period was 11/20/2016.  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   Any events prior to today's visit: no Her periods were: regular, monthly She was using no method when she conceived.  She has Positive signs or symptoms of nausea of pregnancy (mild) She has Negative signs or symptoms of miscarriage or preterm labor On any medications around the time she conceived/early pregnancy: No   ROS: A 12-point review of systems was performed and negative, except as stated in the above HPI.  OBGYN History: As per HPI. OB History  Gravida Para Term Preterm AB Living  4 3 2     2   SAB TAB Ectopic Multiple Live Births        0 1    # Outcome Date GA Lbr Len/2nd Weight Sex Delivery Anes PTL Lv  4 Current           3 Para 04/22/16 6035w3d  0 oz (0 kg) M Vag-Breech None  FD  2 Term 02/06/03 8812w0d  6 lb 4 oz (2.835 kg) F Vag-Spont  N LIV  1 Term 05/28/96 4012w0d  6 lb 8 oz (2.948 kg) M Vag-Spont  N       Any issues with any prior pregnancies: yes (see below) Prior children are healthy, doing well, and without any problems or issues: yes except for last pregnancy.  History of pap smears: Yes. Last pap smear 2017 and results were NILM/HPV pos   Past Medical History: Past Medical History:  Diagnosis Date   . Abnormal Pap smear   . Vaginal Pap smear, abnormal     Past Surgical History: Past Surgical History:  Procedure Laterality Date  . cryo therapy    . CRYOTHERAPY      Family History:  Family History  Problem Relation Age of Onset  . Hyperlipidemia Mother   . Hypertension Mother   . Cancer Neg Hx   . Birth defects Neg Hx    She denies any history of mental retardation, birth defects or genetic disorders in her or the FOB's history  Social History:  Social History   Social History  . Marital status: Single    Spouse name: N/A  . Number of children: N/A  . Years of education: N/A   Occupational History  . Not on file.   Social History Main Topics  . Smoking status: Never Smoker  . Smokeless tobacco: Never Used  . Alcohol use No  . Drug use: No  . Sexual activity: Yes    Birth control/ protection: Pill   Other Topics Concern  . Not on file   Social History Narrative  . No narrative on file    Allergy: No Known Allergies  Health Maintenance:  Mammogram Up to Date: not applicable  Current Outpatient Medications: PNV  Physical Exam:   BP 118/78   Pulse 72   Temp 98.5 F (36.9 C)  Wt 210 lb 6.4 oz (95.4 kg)   LMP 11/20/2016   BMI 41.09 kg/m  Body mass index is 41.09 kg/m. Contractions: Not present Vag. Bleeding: None. Fundal height: not applicable FHTs: 160s  General appearance: Well nourished, well developed female in no acute distress.  Neck:  Supple, normal appearance, and no thyromegaly  Cardiovascular: S1, S2 normal, no murmur, rub or gallop, regular rate and rhythm Respiratory:  Clear to auscultation bilateral. Normal respiratory effort Abdomen: positive bowel sounds and no masses, hernias; diffusely non tender to palpation, non distended Breasts: breasts appear normal, no suspicious masses, no skin or nipple changes or axillary nodes, and normal palpation. Neuro/Psych:  Normal mood and affect.  Skin:  Warm and dry.  Lymphatic:  No  inguinal lymphadenopathy.   Pelvic exam: is not limited by body habitus EGBUS: within normal limits, Vagina: within normal limits and with no blood in the vault, Cervix: normal appearing cervix without discharge or lesions, closed/long/high, Uterus:  nonenlarged, and Adnexa:  normal adnexa and no mass, fullness, tenderness  Laboratory: none  Imaging:  SLIUP c/w 8/0 weeks. FHR 160s  Assessment: pt doing well  Plan: 1. Supervision of high risk pregnancy, antepartum Routine care. Consider starting baby asa after 12wks - SMN1 Copy Number Analysis - [place] MFM Fetal Nuchal Translucency; Future - Obstetric Panel, Including HIV - Culture, OB Urine - Cytology - PAP - Hemoglobinopathy Evaluation - Cystic fibrosis gene test - TSH - Comprehensive metabolic panel - Protein / creatinine ratio, urine - [place] bedside; Future  2. Obesity (BMI 30-39.9) Will do early 2hr in 2wks when she comes back for cffdna - TSH - Comprehensive metabolic panel - Protein / creatinine ratio, urine  3. Obesity in pregnancy  4. Short interval between pregnancies affecting pregnancy in first trimester, antepartum  5. History of cervical incompetence in pregnancy, currently pregnant 04/2016: no cervix seen on routine anatomy u/s. 19wk rescue cerclage place. 20wk membranes in vault, pprom, cerclage removed and delivery  Options d/w pt: ppx cerclage at 14wks, serial CLs starting at 16wks and intervene if it appears to shorten or nothing. I told her that I'd recommend the 1st or 2nd option. Pt to decide and let us know at nv.   6. AMA (advanced maternal age) multigravida 35+, unspecified trimester cffdna nv.   7. Rh negative, antepartum Rhogam prn  Problem list reviewed and updated.  Follow up in 2 weeks.   >50% of 25 min visit spent on counseling and coordination of care.     Cornelia Copa MD Attending Center for [facility])

## 2017-02-10 NOTE — Progress Notes (Signed)
DATING AND VIABILITY SONOGRAM   Rosaland LaoShareka Washington is a 36 y.o. year old 434P2002 with LMP Patient's last menstrual period was 11/20/2016. which would correlate to  5263w0d weeks gestation.  She has regular menstrual cycles.   She is here today for a confirmatory initial sonogram.    GESTATION: SINGLETON     FETAL ACTIVITY:          Heart rate         166          The fetus is active.   GESTATIONAL AGE AND  BIOMETRICS:  Gestational criteria: Estimated Date of Delivery: 09/22/17 by early ultrasound now at 5163w0d  Previous Scans:0  CROWN RUMP LENGTH           1.63cm 5363w0d                                                    AVERAGE EGA(BY THIS SCAN):  6163w0d weeks  WORKING EDD( early ultrasound ):  09/22/17     TECHNICIAN COMMENTS:  SLIUP measuring 1363w0d CRL and FHR 166bpm   A copy of this report including all images has been saved and backed up to a second source for retrieval if needed. All measures and details of the anatomical scan, placentation, fluid volume and pelvic anatomy are contained in that report.  Keyandra Swenson 02/10/2017 9:53 AM

## 2017-02-11 LAB — PROTEIN / CREATININE RATIO, URINE
CREATININE, UR: 58.1 mg/dL
PROTEIN UR: 4.5 mg/dL
PROTEIN/CREAT RATIO: 77 mg/g{creat} (ref 0–200)

## 2017-02-12 LAB — HEMOGLOBINOPATHY EVALUATION
Ferritin: 38 ng/mL (ref 15–150)
HEMOGLOBIN: 14.2 g/dL (ref 11.1–15.9)
HGB A: 61.3 % — AB (ref 96.4–98.8)
HGB C: 0 %
HGB S: 34.4 % — AB
HGB VARIANT: 0 %
Hematocrit: 43.1 % (ref 34.0–46.6)
Hgb A2 Quant: 4.3 % — ABNORMAL HIGH (ref 1.8–3.2)
Hgb F Quant: 0 % (ref 0.0–2.0)
Hgb Solubility: POSITIVE — AB
MCH: 28.2 pg (ref 26.6–33.0)
MCHC: 32.9 g/dL (ref 31.5–35.7)
MCV: 86 fL (ref 79–97)
PLATELETS: 212 10*3/uL (ref 150–379)
RBC: 5.04 x10E6/uL (ref 3.77–5.28)
RDW: 14.7 % (ref 12.3–15.4)
WBC: 7.7 10*3/uL (ref 3.4–10.8)

## 2017-02-12 LAB — URINE CULTURE, OB REFLEX

## 2017-02-12 LAB — CULTURE, OB URINE

## 2017-02-13 ENCOUNTER — Encounter: Payer: Self-pay | Admitting: Obstetrics and Gynecology

## 2017-02-13 DIAGNOSIS — D573 Sickle-cell trait: Secondary | ICD-10-CM | POA: Insufficient documentation

## 2017-02-13 LAB — CYTOLOGY - PAP
Chlamydia: NEGATIVE
Diagnosis: NEGATIVE
HPV: NOT DETECTED
Neisseria Gonorrhea: NEGATIVE

## 2017-02-17 ENCOUNTER — Encounter: Payer: Self-pay | Admitting: *Deleted

## 2017-02-19 LAB — OBSTETRIC PANEL, INCLUDING HIV
ANTIBODY SCREEN: NEGATIVE
BASOS: 1 %
Basophils Absolute: 0 10*3/uL (ref 0.0–0.2)
EOS (ABSOLUTE): 0.1 10*3/uL (ref 0.0–0.4)
EOS: 1 %
HEMATOCRIT: 43.5 % (ref 34.0–46.6)
HEMOGLOBIN: 14.2 g/dL (ref 11.1–15.9)
HIV SCREEN 4TH GENERATION: NONREACTIVE
Hepatitis B Surface Ag: NEGATIVE
Immature Grans (Abs): 0 10*3/uL (ref 0.0–0.1)
Immature Granulocytes: 0 %
LYMPHS ABS: 2 10*3/uL (ref 0.7–3.1)
Lymphs: 25 %
MCH: 28 pg (ref 26.6–33.0)
MCHC: 32.6 g/dL (ref 31.5–35.7)
MCV: 86 fL (ref 79–97)
MONOS ABS: 0.6 10*3/uL (ref 0.1–0.9)
Monocytes: 7 %
Neutrophils Absolute: 5.4 10*3/uL (ref 1.4–7.0)
Neutrophils: 66 %
Platelets: 226 10*3/uL (ref 150–379)
RBC: 5.07 x10E6/uL (ref 3.77–5.28)
RDW: 15 % (ref 12.3–15.4)
RH TYPE: NEGATIVE
RPR Ser Ql: NONREACTIVE
Rubella Antibodies, IGG: 9.1 index (ref >0.99)
WBC: 8.1 10*3/uL (ref 3.4–10.8)

## 2017-02-19 LAB — COMPREHENSIVE METABOLIC PANEL
ALBUMIN: 4.5 g/dL (ref 3.5–5.5)
ALT: 12 IU/L (ref 0–32)
AST: 19 IU/L (ref 0–40)
Albumin/Globulin Ratio: 1.6 (ref 1.2–2.2)
Alkaline Phosphatase: 58 IU/L (ref 39–117)
BILIRUBIN TOTAL: 0.2 mg/dL (ref 0.0–1.2)
BUN/Creatinine Ratio: 14 (ref 9–23)
BUN: 10 mg/dL (ref 6–20)
CHLORIDE: 100 mmol/L (ref 96–106)
CO2: 16 mmol/L — ABNORMAL LOW (ref 20–29)
Calcium: 9.7 mg/dL (ref 8.7–10.2)
Creatinine, Ser: 0.73 mg/dL (ref 0.57–1.00)
GFR calc non Af Amer: 106 mL/min/{1.73_m2} (ref >59)
GFR, EST AFRICAN AMERICAN: 123 mL/min/{1.73_m2} (ref >59)
GLUCOSE: 85 mg/dL (ref 65–99)
Globulin, Total: 2.8 g/dL (ref 1.5–4.5)
Potassium: 3.7 mmol/L (ref 3.5–5.2)
Sodium: 137 mmol/L (ref 134–144)
TOTAL PROTEIN: 7.3 g/dL (ref 6.0–8.5)

## 2017-02-19 LAB — SMN1 COPY NUMBER ANALYSIS (SMA CARRIER SCREENING)

## 2017-02-19 LAB — TSH: TSH: 2.07 u[IU]/mL (ref 0.450–4.500)

## 2017-02-19 LAB — CYSTIC FIBROSIS GENE TEST

## 2017-02-24 ENCOUNTER — Encounter (HOSPITAL_COMMUNITY): Payer: Self-pay

## 2017-02-24 ENCOUNTER — Encounter: Payer: Self-pay | Admitting: Obstetrics & Gynecology

## 2017-02-24 ENCOUNTER — Ambulatory Visit (INDEPENDENT_AMBULATORY_CARE_PROVIDER_SITE_OTHER): Payer: Medicaid Other | Admitting: Obstetrics & Gynecology

## 2017-02-24 VITALS — BP 114/76 | HR 86 | Wt 208.0 lb

## 2017-02-24 DIAGNOSIS — O09299 Supervision of pregnancy with other poor reproductive or obstetric history, unspecified trimester: Secondary | ICD-10-CM

## 2017-02-24 DIAGNOSIS — O0990 Supervision of high risk pregnancy, unspecified, unspecified trimester: Secondary | ICD-10-CM

## 2017-02-24 DIAGNOSIS — O0991 Supervision of high risk pregnancy, unspecified, first trimester: Secondary | ICD-10-CM

## 2017-02-24 DIAGNOSIS — O09291 Supervision of pregnancy with other poor reproductive or obstetric history, first trimester: Secondary | ICD-10-CM

## 2017-02-24 NOTE — Patient Instructions (Signed)
Return to clinic for any scheduled appointments or for any gynecologic concerns as needed.   

## 2017-02-24 NOTE — Progress Notes (Signed)
   PRENATAL VISIT NOTE  Subjective:  Ashley Wilkerson is a 36 y.o. G4P2002 at [redacted]w[redacted]d being seen today for ongoing prenatal care.  She is currently monitored for the following issues for this high-risk pregnancy and has AMA (advanced maternal age) multigravida 35+, unspecified trimester; Maternal condyloma acuminata complicating pregnancy in second trimester; Rh negative status during pregnancy; HPV in female; Supervision of high risk pregnancy, antepartum; Short interval between pregnancies affecting pregnancy in first trimester, antepartum; History of cervical incompetence in pregnancy, currently pregnant; Obesity (BMI 30-39.9); Obesity in pregnancy; and Sickle cell trait (HCC) on her problem list.  Patient reports no complaints.  Contractions: Not present. Vag. Bleeding: None.  Movement: Absent. Denies leaking of fluid.   The following portions of the patient's history were reviewed and updated as appropriate: allergies, current medications, past family history, past medical history, past social history, past surgical history and problem list. Problem list updated.  Objective:   Vitals:   02/24/17 1112  BP: 114/76  Pulse: 86  Weight: 208 lb (94.3 kg)    Fetal Status: Fetal Heart Rate (bpm): 160 on u/s   Movement: Absent     General:  Alert, oriented and cooperative. Patient is in no acute distress.  Skin: Skin is warm and dry. No rash noted.   Cardiovascular: Normal heart rate noted  Respiratory: Normal respiratory effort, no problems with respiration noted  Abdomen: Soft, gravid, appropriate for gestational age.  Pain/Pressure: Absent     Pelvic: Cervical exam deferred        Extremities: Normal range of motion.  Edema: None  Mental Status:  Normal mood and affect. Normal behavior. Normal judgment and thought content.   Assessment and Plan:  Pregnancy: G4P2002 at [redacted]w[redacted]d  1. History of cervical incompetence in pregnancy, currently pregnant Discussed options of cervical cerclage at  13-14 weeks in combination with serial cervical length scans; or just scans alone.  She opted for cerclage + scans. Surgical order sent to [facility] for scheduling; preoperative TV scan added onto 03/17/17 already scheduled NT scan at [facility].  - [place] MFM OB Transvaginal; Future  2. Supervision of high risk pregnancy, antepartum NIPS and early 1 hr GTT done today, will follow up results and manage accordingly. - Genetic Screening - Glucose tolerance, 1 hour No other complaints or concerns.  Routine obstetric precautions reviewed. Please refer to After Visit Summary for other counseling recommendations.  Return in about 4 weeks (around 03/24/2017) for OB Visit.   Jaynie Collins, MD

## 2017-02-25 LAB — GLUCOSE TOLERANCE, 1 HOUR: Glucose, 1Hr PP: 116 mg/dL (ref 65–199)

## 2017-03-09 ENCOUNTER — Telehealth: Payer: Self-pay

## 2017-03-09 ENCOUNTER — Encounter (HOSPITAL_COMMUNITY): Payer: Self-pay | Admitting: Obstetrics and Gynecology

## 2017-03-09 NOTE — Telephone Encounter (Signed)
Call patient to let her know the results of her harmony prenatal testing. Its  A girl and negative for all other test.

## 2017-03-10 ENCOUNTER — Encounter: Payer: Self-pay | Admitting: Obstetrics & Gynecology

## 2017-03-12 NOTE — Patient Instructions (Addendum)
Your procedure is scheduled on:  Thursday, Oct. 25 at 11:30 am  Enter through the [employer] [facility] at: 10 am  Pick up the phone at the desk and dial [phone].  Call this number if you have problems the morning of surgery: [phone].  Remember: Do NOT eat food or drink clear liquids (including water) after midnight Wednesday, 10/24  Take these medicines the morning of surgery with a SIP OF WATER:  None  Do NOT wear jewelry (body piercing), metal hair clips/bobby pins, make-up, or nail polish. Do NOT wear lotions, powders, or perfumes.  You may wear deoderant. Do NOT shave for 48 hours prior to surgery. Do NOT bring valuables to the hospital. Contacts may not be worn into surgery.  Have a responsible adult drive you home and stay with you for 24 hours after your procedure.  Home with husband Ashley Wilkerson cell [phone].

## 2017-03-13 ENCOUNTER — Encounter (HOSPITAL_COMMUNITY): Payer: Self-pay

## 2017-03-16 ENCOUNTER — Encounter (HOSPITAL_COMMUNITY): Payer: Self-pay

## 2017-03-16 ENCOUNTER — Encounter (HOSPITAL_COMMUNITY)
Admission: RE | Admit: 2017-03-16 | Discharge: 2017-03-16 | Disposition: A | Discharge status: Home / Self Care | Payer: Medicaid Other | Source: Ambulatory Visit | Attending: Obstetrics & Gynecology | Admitting: Obstetrics & Gynecology

## 2017-03-16 DIAGNOSIS — Z01818 Encounter for other preprocedural examination: Secondary | ICD-10-CM | POA: Diagnosis present

## 2017-03-16 HISTORY — DX: Supervision of pregnancy with other poor reproductive or obstetric history, unspecified trimester: O09.299

## 2017-03-16 LAB — CBC
HCT: 40.1 % (ref 36.0–46.0)
HEMOGLOBIN: 13.9 g/dL (ref 12.0–15.0)
MCH: 29 pg (ref 26.0–34.0)
MCHC: 34.7 g/dL (ref 30.0–36.0)
MCV: 83.7 fL (ref 78.0–100.0)
PLATELETS: 202 10*3/uL (ref 150–400)
RBC: 4.79 MIL/uL (ref 3.87–5.11)
RDW: 13.8 % (ref 11.5–15.5)
WBC: 9.7 10*3/uL (ref 4.0–10.5)

## 2017-03-17 ENCOUNTER — Other Ambulatory Visit: Payer: Self-pay | Admitting: Obstetrics and Gynecology

## 2017-03-17 ENCOUNTER — Ambulatory Visit (HOSPITAL_COMMUNITY)
Admission: RE | Admit: 2017-03-17 | Discharge: 2017-03-17 | Disposition: A | Discharge status: Home / Self Care | Payer: Medicaid Other | Source: Ambulatory Visit | Attending: Obstetrics and Gynecology | Admitting: Obstetrics and Gynecology

## 2017-03-17 ENCOUNTER — Encounter (HOSPITAL_COMMUNITY): Payer: Self-pay

## 2017-03-17 DIAGNOSIS — O09521 Supervision of elderly multigravida, first trimester: Secondary | ICD-10-CM | POA: Diagnosis not present

## 2017-03-17 DIAGNOSIS — Z3A13 13 weeks gestation of pregnancy: Secondary | ICD-10-CM | POA: Diagnosis not present

## 2017-03-17 DIAGNOSIS — O3431 Maternal care for cervical incompetence, first trimester: Secondary | ICD-10-CM | POA: Insufficient documentation

## 2017-03-17 DIAGNOSIS — Z3682 Encounter for antenatal screening for nuchal translucency: Secondary | ICD-10-CM | POA: Insufficient documentation

## 2017-03-17 DIAGNOSIS — O99211 Obesity complicating pregnancy, first trimester: Secondary | ICD-10-CM | POA: Diagnosis not present

## 2017-03-17 DIAGNOSIS — O09291 Supervision of pregnancy with other poor reproductive or obstetric history, first trimester: Secondary | ICD-10-CM | POA: Diagnosis not present

## 2017-03-17 DIAGNOSIS — O09891 Supervision of other high risk pregnancies, first trimester: Secondary | ICD-10-CM | POA: Insufficient documentation

## 2017-03-17 DIAGNOSIS — Z6841 Body Mass Index (BMI) 40.0 and over, adult: Secondary | ICD-10-CM | POA: Diagnosis not present

## 2017-03-17 DIAGNOSIS — O0991 Supervision of high risk pregnancy, unspecified, first trimester: Secondary | ICD-10-CM | POA: Diagnosis present

## 2017-03-17 DIAGNOSIS — O0990 Supervision of high risk pregnancy, unspecified, unspecified trimester: Secondary | ICD-10-CM

## 2017-03-17 DIAGNOSIS — O09299 Supervision of pregnancy with other poor reproductive or obstetric history, unspecified trimester: Secondary | ICD-10-CM

## 2017-03-24 ENCOUNTER — Ambulatory Visit (INDEPENDENT_AMBULATORY_CARE_PROVIDER_SITE_OTHER): Payer: Medicaid Other | Admitting: Obstetrics & Gynecology

## 2017-03-24 ENCOUNTER — Encounter: Payer: Medicaid Other | Admitting: Obstetrics & Gynecology

## 2017-03-24 ENCOUNTER — Other Ambulatory Visit: Payer: Self-pay

## 2017-03-24 VITALS — BP 115/75 | HR 101 | Wt 211.0 lb

## 2017-03-24 DIAGNOSIS — O09292 Supervision of pregnancy with other poor reproductive or obstetric history, second trimester: Secondary | ICD-10-CM

## 2017-03-24 DIAGNOSIS — O09299 Supervision of pregnancy with other poor reproductive or obstetric history, unspecified trimester: Secondary | ICD-10-CM

## 2017-03-24 DIAGNOSIS — O0992 Supervision of high risk pregnancy, unspecified, second trimester: Secondary | ICD-10-CM

## 2017-03-24 DIAGNOSIS — O0990 Supervision of high risk pregnancy, unspecified, unspecified trimester: Secondary | ICD-10-CM

## 2017-03-24 DIAGNOSIS — Z3689 Encounter for other specified antenatal screening: Secondary | ICD-10-CM

## 2017-03-24 MED ORDER — PROGESTERONE MICRONIZED 200 MG PO CAPS: ORAL_CAPSULE | ORAL | 3 refills | Status: DC

## 2017-03-24 NOTE — Patient Instructions (Addendum)
Return to clinic for any scheduled appointments or obstetric concerns, or go to [facility] for evaluation    Cerclage of the Cervix Cerclage of the cervix is a surgical procedure for an incompetent cervix. An incompetent cervix is a weak cervix that opens up before labor begins. Cerclage of the cervix sews the cervix closed during pregnancy.  LET YOUR CAREGIVER KNOW ABOUT:   Allergies to foods or medications.  All over-the-counter, prescription, herbal, eye drops and cream medications you are using.  Taking illegal drugs or drinking an excessive amount of alcohol.  Any recent colds or infections.  Past problems with anesthetics or novocaine.  Past surgery.  History of blood clots or abnormal bleeding problems.  Other medical or health problems. RISKS AND COMPLICATIONS   Infection.  Bleeding.  Rupturing the amniotic sac (membranes).  Going into early labor and delivery.  Problems with the anesthesia.  Infection of the amniotic sac. BEFORE THE PROCEDURE   Do not take aspirin.  Do not eat or drink anything 8 hours before the procedure.  Do not smoke.  If you are being admitted the same day as the procedure, arrive at the hospital at least 60 minutes before the surgery or as directed. During this time, you will sign the necessary forms and get prepared for the surgery.  A waiting area is available for family and friends. PROCEDURE   You will be given an IV (intravenous) and medication to relax you.  You will be given a spinal for anesthesia.  A stitch will be placed in and around the cervix to tighten it and keep it closed. AFTER THE PROCEDURE   You will go to a recovery room where you and the baby are monitored.  Once you are awake, stable, and taking fluids well, barring other problems, you will be allowed to go home  You may get progesterone to prevent uterine contractions and stabilize cervical length.  Have someone drive you home and stay with you for a day or  two.  You may be given medications to take when you go home. HOME CARE INSTRUCTIONS   Only take over-the-counter or prescriptions medicines for pain, discomfort or fever as directed by your caregiver.  Avoid physical activities and exercise until your caregiver says it is okay.  Resume your usual diet.  Do not douche.  Do not have sexual intercourse for one week after procedure  Keep your follow up surgical and prenatal appointments with your caregiver. SEEK MEDICAL CARE IF:   You have abnormal vaginal discharge.  You develop a rash.  You are having problems with your medications.  You become lightheaded or feel faint. SEEK IMMEDIATE MEDICAL CARE IF:   You develop vaginal bleeding.  You are leaking fluid or have a gush of fluid from the vagina.  You develop a temperature of 102 F (38.9 C) or higher.  You pass out.  You have uterine contractions.  You feel the baby is not moving as much as usual or cannot feel the baby move. Document Released: 05/01/2008 Document Revised: 08/11/2011 Document Reviewed: 05/01/2008 [facility] Patient Information 2013 [employer] [place].

## 2017-03-24 NOTE — Progress Notes (Signed)
   PRENATAL VISIT NOTE  Subjective:  Ashley Wilkerson is a 36 y.o. G4P2002 at [redacted]w[redacted]d being seen today for ongoing prenatal care.  She is currently monitored for the following issues for this high-risk pregnancy and has AMA (advanced maternal age) multigravida 35+; Maternal condyloma acuminata complicating pregnancy in second trimester; Rh negative status during pregnancy; HPV in female; Supervision of high risk pregnancy, antepartum; Short interval between pregnancies affecting pregnancy in first trimester, antepartum; History of cervical incompetence in pregnancy, currently pregnant; Obesity (BMI 30-39.9); Obesity in pregnancy; and Sickle cell trait (HCC) on her problem list.  Patient reports no complaints.   .  .  Movement: Absent. Denies leaking of fluid.   The following portions of the patient's history were reviewed and updated as appropriate: allergies, current medications, past family history, past medical history, past social history, past surgical history and problem list. Problem list updated.  Objective:   Vitals:   03/24/17 1606  BP: 115/75  Pulse: (!) 101  Weight: 211 lb (95.7 kg)    Fetal Status: Fetal Heart Rate (bpm): 166   Movement: Absent     General:  Alert, oriented and cooperative. Patient is in no acute distress.  Skin: Skin is warm and dry. No rash noted.   Cardiovascular: Normal heart rate noted  Respiratory: Normal respiratory effort, no problems with respiration noted  Abdomen: Soft, gravid, appropriate for gestational age.  Pain/Pressure: Absent     Pelvic: Cervical exam deferred        Extremities: Normal range of motion.  Edema: None  Mental Status:  Normal mood and affect. Normal behavior. Normal judgment and thought content.   Assessment and Plan:  Pregnancy: G4P2002 at [redacted]w[redacted]d  1. History of cervical incompetence in pregnancy, currently pregnant Cerclage placement to occur on 03/26/17.  The risks of surgery were discussed in detail with the  patient including but not limited to: bleeding; infection which may require antibiotic therapy; injury to cervix, vagina other surrounding organs; risk of ruptured membranes and/or preterm delivery and other postoperative or anesthesia complications.  The patient also understands the alternative treatment options which were discussed in full. All questions were answered.  Routine preoperative instructions of having nothing to eat or drink after midnight on the day prior to surgery and also coming to the hospital 1.5 hours prior to her time of surgery were also emphasized.  Printed patient education handouts about the procedure were given to the patient to review at home.  Patient also desires prophylactic vaginal progesterone. This has not shown to be harmful and could help, so this was prescribed. - US MFM OB Transvaginal; Future. Serial scans every 2 weeks. - progesterone (PROMETRIUM) 200 MG capsule; Place one capsule vaginally at bedtime  Dispense: 30 capsule; Refill: 3  2. Encounter for fetal anatomic survey Anatomy scan ordered - US MFM OB DETAIL +14 WK; Future  3. Supervision of high risk pregnancy, antepartum No other complaints or concerns.  Routine obstetric precautions reviewed. Please refer to After Visit Summary for other counseling recommendations.  Return in about 2 weeks (around 04/07/2017) for OB Visit, postop check.   Rockelle Heuerman, MD  

## 2017-03-26 ENCOUNTER — Ambulatory Visit (HOSPITAL_COMMUNITY): Payer: Medicaid Other | Admitting: Anesthesiology

## 2017-03-26 ENCOUNTER — Ambulatory Visit (HOSPITAL_COMMUNITY)
Admission: AD | Admit: 2017-03-26 | Discharge: 2017-03-26 | Disposition: A | Discharge status: Home / Self Care | Payer: Medicaid Other | Source: Ambulatory Visit | Attending: Obstetrics & Gynecology | Admitting: Obstetrics & Gynecology

## 2017-03-26 ENCOUNTER — Encounter (HOSPITAL_COMMUNITY)
Admission: AD | Disposition: A | Discharge status: Home / Self Care | Payer: Self-pay | Source: Ambulatory Visit | Attending: Obstetrics & Gynecology

## 2017-03-26 DIAGNOSIS — O09522 Supervision of elderly multigravida, second trimester: Secondary | ICD-10-CM | POA: Diagnosis not present

## 2017-03-26 DIAGNOSIS — E669 Obesity, unspecified: Secondary | ICD-10-CM | POA: Insufficient documentation

## 2017-03-26 DIAGNOSIS — Z3A14 14 weeks gestation of pregnancy: Secondary | ICD-10-CM | POA: Diagnosis not present

## 2017-03-26 DIAGNOSIS — O3432 Maternal care for cervical incompetence, second trimester: Secondary | ICD-10-CM

## 2017-03-26 DIAGNOSIS — O99212 Obesity complicating pregnancy, second trimester: Secondary | ICD-10-CM | POA: Insufficient documentation

## 2017-03-26 DIAGNOSIS — Z6830 Body mass index (BMI) 30.0-30.9, adult: Secondary | ICD-10-CM | POA: Diagnosis not present

## 2017-03-26 HISTORY — PX: CERVICAL CERCLAGE: SHX1329

## 2017-03-26 SURGERY — CERCLAGE, CERVIX, VAGINAL APPROACH
Anesthesia: Spinal | Site: Vagina

## 2017-03-26 MED ORDER — FENTANYL CITRATE (PF) 100 MCG/2ML IJ SOLN: 25.0000 ug | INTRAMUSCULAR | Status: DC | PRN

## 2017-03-26 MED ORDER — FENTANYL CITRATE (PF) 100 MCG/2ML IJ SOLN
INTRAMUSCULAR | Status: AC
  Filled 2017-03-26: qty 2

## 2017-03-26 MED ORDER — INDOMETHACIN 50 MG RE SUPP
RECTAL | Status: DC | PRN
  Administered 2017-03-26: 100 mg via RECTAL

## 2017-03-26 MED ORDER — OXYCODONE-ACETAMINOPHEN 5-325 MG PO TABS: 1.0000 | ORAL_TABLET | Freq: Four times a day (QID) | ORAL | 0 refills | Status: DC | PRN

## 2017-03-26 MED ORDER — DOCUSATE SODIUM 100 MG PO CAPS: 100.0000 mg | ORAL_CAPSULE | Freq: Two times a day (BID) | ORAL | 2 refills | Status: DC | PRN

## 2017-03-26 MED ORDER — ONDANSETRON HCL 4 MG/2ML IJ SOLN
INTRAMUSCULAR | Status: AC
  Filled 2017-03-26: qty 2

## 2017-03-26 MED ORDER — ONDANSETRON HCL 4 MG/2ML IJ SOLN: 4.0000 mg | Freq: Once | INTRAMUSCULAR | Status: DC | PRN

## 2017-03-26 MED ORDER — LACTATED RINGERS IV SOLN
INTRAVENOUS | Status: DC
  Administered 2017-03-26: 11:00:00 via INTRAVENOUS

## 2017-03-26 MED ORDER — BUPIVACAINE IN DEXTROSE 0.75-8.25 % IT SOLN
INTRATHECAL | Status: DC | PRN
  Administered 2017-03-26: 1.2 mL via INTRATHECAL

## 2017-03-26 MED ORDER — BUPIVACAINE HCL (PF) 0.5 % IJ SOLN
INTRAMUSCULAR | Status: AC
  Filled 2017-03-26: qty 30

## 2017-03-26 MED ORDER — BUPIVACAINE HCL (PF) 0.5 % IJ SOLN
INTRAMUSCULAR | Status: DC | PRN
  Administered 2017-03-26: 20 mL

## 2017-03-26 MED ORDER — ONDANSETRON HCL 4 MG/2ML IJ SOLN
INTRAMUSCULAR | Status: DC | PRN
  Administered 2017-03-26: 4 mg via INTRAVENOUS

## 2017-03-26 MED ORDER — FENTANYL CITRATE (PF) 100 MCG/2ML IJ SOLN
INTRAMUSCULAR | Status: DC | PRN
  Administered 2017-03-26 (×2): 50 ug via INTRAVENOUS

## 2017-03-26 MED ORDER — INDOMETHACIN 50 MG RE SUPP
100.0000 mg | Freq: Once | RECTAL | Status: DC
  Filled 2017-03-26: qty 2

## 2017-03-26 SURGICAL SUPPLY — 18 items
CANISTER SUCT 3000ML PPV (MISCELLANEOUS) ×3 IMPLANT
ELECT REM PT RETURN 9FT ADLT (ELECTROSURGICAL)
ELECTRODE REM PT RTRN 9FT ADLT (ELECTROSURGICAL) IMPLANT
GLOVE BIOGEL PI IND STRL 7.0 (GLOVE) ×3 IMPLANT
GLOVE BIOGEL PI INDICATOR 7.0 (GLOVE) ×6
GLOVE ECLIPSE 7.0 STRL STRAW (GLOVE) ×3 IMPLANT
GOWN STRL REUS W/TWL LRG LVL3 (GOWN DISPOSABLE) ×6 IMPLANT
PACK VAGINAL MINOR WOMEN LF (CUSTOM PROCEDURE TRAY) ×3 IMPLANT
PAD OB MATERNITY 4.3X12.25 (PERSONAL CARE ITEMS) ×3 IMPLANT
PAD PREP 24X48 CUFFED NSTRL (MISCELLANEOUS) ×3 IMPLANT
PENCIL BUTTON HOLSTER BLD 10FT (ELECTRODE) IMPLANT
SUT PROLENE 1 CT 1 30 (SUTURE) ×3 IMPLANT
SYR BULB IRRIGATION 50ML (SYRINGE) IMPLANT
TOWEL OR 17X24 6PK STRL BLUE (TOWEL DISPOSABLE) ×6 IMPLANT
TRAY FOLEY CATH SILVER 14FR (SET/KITS/TRAYS/PACK) ×3 IMPLANT
TUBING NON-CON 1/4 X 20 CONN (TUBING) IMPLANT
TUBING NON-CON 1/4 X 20' CONN (TUBING)
YANKAUER SUCT BULB TIP NO VENT (SUCTIONS) IMPLANT

## 2017-03-26 NOTE — H&P (View-Only) (Signed)
   PRENATAL VISIT NOTE  Subjective:  Ashley Wilkerson is a 36 y.o. G4P2002 at 6469w0d being seen today for ongoing prenatal care.  She is currently monitored for the following issues for this high-risk pregnancy and has AMA (advanced maternal age) multigravida 35+; Maternal condyloma acuminata complicating pregnancy in second trimester; Rh negative status during pregnancy; HPV in female; Supervision of high risk pregnancy, antepartum; Short interval between pregnancies affecting pregnancy in first trimester, antepartum; History of cervical incompetence in pregnancy, currently pregnant; Obesity (BMI 30-39.9); Obesity in pregnancy; and Sickle cell trait (HCC) on her problem list.  Patient reports no complaints.   .  .  Movement: Absent. Denies leaking of fluid.   The following portions of the patient's history were reviewed and updated as appropriate: allergies, current medications, past family history, past medical history, past social history, past surgical history and problem list. Problem list updated.  Objective:   Vitals:   03/24/17 1606  BP: 115/75  Pulse: (!) 101  Weight: 211 lb (95.7 kg)    Fetal Status: Fetal Heart Rate (bpm): 166   Movement: Absent     General:  Alert, oriented and cooperative. Patient is in no acute distress.  Skin: Skin is warm and dry. No rash noted.   Cardiovascular: Normal heart rate noted  Respiratory: Normal respiratory effort, no problems with respiration noted  Abdomen: Soft, gravid, appropriate for gestational age.  Pain/Pressure: Absent     Pelvic: Cervical exam deferred        Extremities: Normal range of motion.  Edema: None  Mental Status:  Normal mood and affect. Normal behavior. Normal judgment and thought content.   Assessment and Plan:  Pregnancy: G4P2002 at 3069w0d  1. History of cervical incompetence in pregnancy, currently pregnant Cerclage placement to occur on 03/26/17.  The risks of surgery were discussed in detail with the  patient including but not limited to: bleeding; infection which may require antibiotic therapy; injury to cervix, vagina other surrounding organs; risk of ruptured membranes and/or preterm delivery and other postoperative or anesthesia complications.  The patient also understands the alternative treatment options which were discussed in full. All questions were answered.  Routine preoperative instructions of having nothing to eat or drink after midnight on the day prior to surgery and also coming to the hospital 1.5 hours prior to her time of surgery were also emphasized.  Printed patient education handouts about the procedure were given to the patient to review at home.  Patient also desires prophylactic vaginal progesterone. This has not shown to be harmful and could help, so this was prescribed. - US MFM OB Transvaginal; Future. Serial scans every 2 weeks. - progesterone (PROMETRIUM) 200 MG capsule; Place one capsule vaginally at bedtime  Dispense: 30 capsule; Refill: 3  2. Encounter for fetal anatomic survey Anatomy scan ordered - US MFM OB DETAIL +14 WK; Future  3. Supervision of high risk pregnancy, antepartum No other complaints or concerns.  Routine obstetric precautions reviewed. Please refer to After Visit Summary for other counseling recommendations.  Return in about 2 weeks (around 04/07/2017) for OB Visit, postop check.   Jaynie CollinsUgonna Anyanwu, MD

## 2017-03-26 NOTE — Transfer of Care (Signed)
Immediate Anesthesia Transfer of Care Note  Patient: Ashley Wilkerson  Procedure(s) Performed: CERCLAGE CERVICAL (N/A Vagina )  Patient Location: PACU  Anesthesia Type:Spinal  Level of Consciousness: awake, alert  and oriented  Airway & Oxygen Therapy: Patient Spontanous Breathing  Post-op Assessment: Report given to RN and Post -op Vital signs reviewed and stable  Post vital signs: Reviewed and stable  Last Vitals:  Vitals:   03/26/17 1009  BP: (!) 150/92  Pulse: (!) 101  Resp: 18  Temp: 37.2 C  SpO2: 100%    Last Pain:  Vitals:   03/26/17 1009  TempSrc: Oral      Patients Stated Pain Goal: 4 (03/26/17 1009)  Complications: No apparent anesthesia complications

## 2017-03-26 NOTE — Anesthesia Procedure Notes (Signed)
Spinal  Patient location during procedure: OR Start time: 03/26/2017 11:40 AM End time: 03/26/2017 11:43 AM Staffing Anesthesiologist: Cecile HearingURK, Ewin Rehberg EDWARD Performed: anesthesiologist  Preanesthetic Checklist Completed: patient identified, surgical consent, pre-op evaluation, timeout performed, IV checked, risks and benefits discussed and monitors and equipment checked Spinal Block Patient position: sitting Prep: site prepped and draped and DuraPrep Patient monitoring: continuous pulse ox and blood pressure Approach: midline Location: L3-4 Injection technique: single-shot Needle Needle type: Pencan  Needle gauge: 24 G Additional Notes Functioning IV was confirmed and monitors were applied. Sterile prep and drape, including hand hygiene, mask and sterile gloves were used. The patient was positioned and the spine was prepped. The skin was anesthetized with lidocaine.  Free flow of clear CSF was obtained prior to injecting local anesthetic into the CSF.  The spinal needle aspirated freely following injection.  The needle was carefully withdrawn.  The patient tolerated the procedure well. Consent was obtained prior to procedure with all questions answered and concerns addressed. Risks including but not limited to bleeding, infection, nerve damage, paralysis, failed block, inadequate analgesia, allergic reaction, high spinal, itching and headache were discussed and the patient wished to proceed.   Arrie AranStephen Kortnie Stovall, MD

## 2017-03-26 NOTE — Op Note (Signed)
Ashley Wilkerson    PROCEDURE DATE: 03/26/2017  PREOPERATIVE DIAGNOSIS: Intrauterine pregnancy at 724w2d, history of cervical incompetence   POSTOPERATIVE DIAGNOSIS: The same PROCEDURE: Transvaginal McDonald Cervical Cerclage Placement SURGEON:  Dr. Jaynie CollinsUgonna Tyrese Ficek  INDICATIONS: 36 y.o. [id number] at 604w2d with history of cervical incompetence, here for cerclage placement.   The risks of surgery were discussed in detail with the patient including but not limited to: bleeding; infection which may require antibiotic therapy; injury to cervix, vagina other surrounding organs; risk of ruptured membranes and/or preterm delivery and other postoperative or anesthesia complications.  Written informed consent was obtained.    FINDINGS:  About 2 cm palpable cervical length in the vagina, closed cervix, suture knot placed anteriorly.  ANESTHESIA:  Spinal ESTIMATED BLOOD LOSS: 20 ml COMPLICATIONS: None immediate  PROCEDURE IN DETAIL:  The patient received intravenous antibiotics and had sequential compression devices applied to her lower extremities while in the preoperative area.  Reassuring fetal heart rate was also obtained using a doppler. She was then taken to the operating room where spinal anesthesia was administered and was found to be adequate.  She was placed in the dorsal lithotomy, and was prepped and draped in a sterile manner. Her bladder was catheterized for an unmeasured amount of clear, yellow urine.  After an adequate timeout was performed, a vaginal speculum was then placed in the patient's vagina.  The anterior and posterior lips of the cervix were grasped with ring forceps. A curved needle with a 1-0 Prolene suture was inserted at 12 o'clock, as high as possible at the junction of the rugated vaginal epithelium and the smooth cervix, at least 2 cm above the external os.  Four bites are taken circumferentially around the entire cervix in a purse-string fashion, each bite should be deep  enough to extend at least midway into the cervical stroma, but not into the endocervical canal. The two ends of the suture were then tied securely anteriorly and cut, leaving the ends long enough to grasp with a clamp when it is time to remove it. A paracervical block using 0.5 % Marcaine was administered. There was minimal bleeding noted and the ring forceps were removed with good hemostasis noted.  No immediate complications noted.  All instruments were removed from the patient's vagina.  Indomethacin 100 mg rectal suppository was placed.  Instrument, needle and sponge counts were correct x 2. The patient tolerated the procedure well, and was taken to the recovery area awake and in stable condition.  Reassuring fetal heart rate was also obtained using a doppler in the recovery area.  The patient will be discharged to home as per PACU criteria.  Routine postoperative instructions given.  She was prescribed Percocet and Colace.  She will follow up in the clinic on 04/09/2017 for postoperative evaluation and ongoing prenatal care.   Jaynie CollinsUGONNA  [name], MD, FACOG Attending Obstetrician & Gynecologist Faculty Practice, [facility]

## 2017-03-26 NOTE — Anesthesia Preprocedure Evaluation (Signed)
Anesthesia Evaluation  Patient identified by MRN, date of birth, ID band Patient awake    Reviewed: Allergy & Precautions, NPO status , Patient's Chart, lab work & pertinent test results  Airway Mallampati: II  TM Distance: >3 FB Neck ROM: Full    Dental  (+) Teeth Intact, Dental Advisory Given   Pulmonary neg pulmonary ROS,    Pulmonary exam normal breath sounds clear to auscultation       Cardiovascular negative cardio ROS Normal cardiovascular exam Rhythm:Regular Rate:Normal     Neuro/Psych negative neurological ROS  negative psych ROS   GI/Hepatic negative GI ROS, Neg liver ROS,   Endo/Other  Morbid obesity  Renal/GU negative Renal ROS     Musculoskeletal negative musculoskeletal ROS (+)   Abdominal   Peds  Hematology  (+) Blood dyscrasia, Sickle cell trait , Plt 202k   Anesthesia Other Findings Day of surgery medications reviewed with the patient.  Reproductive/Obstetrics (+) Pregnancy                             Anesthesia Physical Anesthesia Plan  ASA: III  Anesthesia Plan: Spinal   Post-op Pain Management:    Induction:   PONV Risk Score and Plan: 2 and Ondansetron, Dexamethasone and Treatment may vary due to age or medical condition  Airway Management Planned:   Additional Equipment:   Intra-op Plan:   Post-operative Plan:   Informed Consent: I have reviewed the patients History and Physical, chart, labs and discussed the procedure including the risks, benefits and alternatives for the proposed anesthesia with the patient or authorized representative who has indicated his/her understanding and acceptance.   Dental advisory given  Plan Discussed with: CRNA, Anesthesiologist and Surgeon  Anesthesia Plan Comments: (Discussed risks and benefits of and differences between spinal and general. Discussed risks of spinal including headache, backache, failure, bleeding,  infection, and nerve damage. Patient consents to spinal. Questions answered. Coagulation studies and platelet count acceptable.)        Anesthesia Quick Evaluation

## 2017-03-26 NOTE — Discharge Instructions (Signed)
[facility] - Preparing for Surgery  Before surgery, you can play an important role.  Because skin is not sterile, your skin needs to be as free of germs as possible.  You can reduce the number of germs on you skin by washing with CHG (chlorahexidine gluconate) soap before surgery.  CHG is an antiseptic cleaner which kills germs and bonds with the skin to continue killing germs even after washing.  Please DO NOT use if you have an allergy to CHG or antibacterial soaps.  If your skin becomes reddened/irritated stop using the CHG and inform your nurse when you arrive at Short Stay.  Do not shave (including legs and underarms) for at least 48 hours prior to the first CHG shower.  You may shave your face.  Please follow these instructions carefully:   1.  Shower with CHG Soap the night before surgery and the                                morning of Surgery.  2.  If you choose to wash your hair, wash your hair first as usual with your       normal shampoo.  3.  After you shampoo, rinse your hair and body thoroughly to remove the                      Shampoo.  4.  Use CHG as you would any other liquid soap.  You can apply chg directly       to the skin and wash gently with scrungie or a clean washcloth.  5.  Apply the CHG Soap to your body ONLY FROM THE NECK DOWN.        Do not use on open wounds or open sores.  Avoid contact with your eyes,       ears, mouth and genitals (private parts).  Wash genitals (private parts)       with your normal soap.  6.  Wash thoroughly, paying special attention to the area where your surgery        will be performed.  7.  Thoroughly rinse your body with warm water from the neck down.  8.  DO NOT shower/wash with your normal soap after using and rinsing off       the CHG Soap.  9.  Pat yourself dry with a clean towel.            10.  Wear clean pajamas.            11.  Place clean sheets on your bed the night of your first shower and do not        sleep with  pets.  Day of Surgery  Do not apply any lotions/deoderants the morning of surgery.  Please wear clean clothes to the hospital/surgery center.  Cerclage of the Cervix Cerclage of the cervix is a surgical procedure for an incompetent cervix. An incompetent cervix is a weak cervix that opens up before labor begins. Cerclage of the cervix sews the cervix closed during pregnancy.  LET YOUR CAREGIVER KNOW ABOUT:   Allergies to foods or medications.  All over-the-counter, prescription, herbal, eye drops and cream medications you are using.  Taking illegal drugs or drinking an excessive amount of alcohol.  Any recent colds or infections.  Past problems with anesthetics or novocaine.  Past surgery.  History of blood clots or abnormal  bleeding problems.  Other medical or health problems. RISKS AND COMPLICATIONS   Infection.  Bleeding.  Rupturing the amniotic sac (membranes).  Going into early labor and delivery.  Problems with the anesthesia.  Infection of the amniotic sac. BEFORE THE PROCEDURE   Do not take aspirin.  Do not eat or drink anything 8 hours before the procedure.  Do not smoke.  If you are being admitted the same day as the procedure, arrive at the hospital at least 60 minutes before the surgery or as directed. During this time, you will sign the necessary forms and get prepared for the surgery.  A waiting area is available for family and friends. PROCEDURE   You will be given an IV (intravenous) and medication to relax you.  You will be given a spinal for anesthesia.  A stitch will be placed in and around the cervix to tighten it and keep it closed. AFTER THE PROCEDURE   You will go to a recovery room where you and the baby are monitored.  Once you are awake, stable, and taking fluids well, barring other problems, you will be allowed to go home  You may get progesterone to prevent uterine contractions and stabilize cervical length.  Have someone  drive you home and stay with you for a day or two.  You may be given medications to take when you go home. HOME CARE INSTRUCTIONS   Only take over-the-counter or prescriptions medicines for pain, discomfort or fever as directed by your caregiver.  Avoid physical activities and exercise until your caregiver says it is okay.  Resume your usual diet.  Do not douche.  Do not have sexual intercourse for one week after procedure  Keep your follow up surgical and prenatal appointments with your caregiver. SEEK MEDICAL CARE IF:   You have abnormal vaginal discharge.  You develop a rash.  You are having problems with your medications.  You become lightheaded or feel faint. SEEK IMMEDIATE MEDICAL CARE IF:   You develop vaginal bleeding.  You are leaking fluid or have a gush of fluid from the vagina.  You develop a temperature of 102 F (38.9 C) or higher.  You pass out.  You have uterine contractions.  You feel the baby is not moving as much as usual or cannot feel the baby move. Document Released: 05/01/2008 Document Revised: 08/11/2011 Document Reviewed: 05/01/2008 [facility] Patient Information 2013 [place], [place].

## 2017-03-26 NOTE — Anesthesia Postprocedure Evaluation (Signed)
Anesthesia Post Note  Patient: Heron SabinsShareka M [place]more  Procedure(s) Performed: CERCLAGE CERVICAL (N/A Vagina )     Patient location during evaluation: PACU Anesthesia Type: Spinal Level of consciousness: oriented and awake and alert Pain management: pain level controlled Vital Signs Assessment: post-procedure vital signs reviewed and stable Respiratory status: spontaneous breathing and respiratory function stable Cardiovascular status: blood pressure returned to baseline and stable Postop Assessment: no headache, no backache and no apparent nausea or vomiting Anesthetic complications: no Comments: 2nd antiemetic not given due to spinal procedure in pregnant patient, and no patient complaint of nausea/vomiting.     Last Vitals:  Vitals:   03/26/17 1430 03/26/17 1515  BP: 118/66 123/73  Pulse: 88 85  Resp: (!) 22 20  Temp:    SpO2: 100% 100%    Last Pain:  Vitals:   03/26/17 1009  TempSrc: Oral   Pain Goal: Patients Stated Pain Goal: 4 (03/26/17 1009)               Cecile HearingStephen Edward Turk

## 2017-03-26 NOTE — Interval H&P Note (Signed)
History and Physical Interval Note 03/26/2017 10:15 AM  Ashley SabinsShareka M [place]more  has presented today for surgery at 3913w2d with the diagnosis of history of incompetent cervix.  The various methods of treatment have been discussed with the patient and family. After consideration of risks, benefits and other options for treatment, the patient [facility] .  The patient's history has been reviewed, patient examined, no change in status, stable for surgery.  I have reviewed the patient's chart and labs. FHR reassuring. Questions were answered to the patient's satisfaction.  To OR when ready.   Jaynie CollinsUGONNA  Latiffany Harwick, MD, FACOG Attending Obstetrician & Gynecologist, [facility], [facility]

## 2017-03-26 NOTE — Progress Notes (Signed)
FHR 155 in Short Stay checked by Dr. Macon LargeAnyanwu.

## 2017-03-27 ENCOUNTER — Encounter (HOSPITAL_COMMUNITY): Payer: Self-pay | Admitting: Obstetrics & Gynecology

## 2017-03-31 ENCOUNTER — Encounter (HOSPITAL_COMMUNITY): Payer: Self-pay

## 2017-04-01 ENCOUNTER — Other Ambulatory Visit: Payer: Self-pay | Admitting: Obstetrics & Gynecology

## 2017-04-01 ENCOUNTER — Other Ambulatory Visit (HOSPITAL_COMMUNITY): Payer: Self-pay | Admitting: *Deleted

## 2017-04-01 ENCOUNTER — Ambulatory Visit (HOSPITAL_COMMUNITY)
Admission: RE | Admit: 2017-04-01 | Discharge: 2017-04-01 | Disposition: A | Discharge status: Home / Self Care | Payer: Medicaid Other | Source: Ambulatory Visit | Attending: Obstetrics & Gynecology | Admitting: Obstetrics & Gynecology

## 2017-04-01 ENCOUNTER — Encounter (HOSPITAL_COMMUNITY): Payer: Self-pay

## 2017-04-01 DIAGNOSIS — O09892 Supervision of other high risk pregnancies, second trimester: Secondary | ICD-10-CM | POA: Diagnosis not present

## 2017-04-01 DIAGNOSIS — Z3A15 15 weeks gestation of pregnancy: Secondary | ICD-10-CM | POA: Diagnosis not present

## 2017-04-01 DIAGNOSIS — O09522 Supervision of elderly multigravida, second trimester: Secondary | ICD-10-CM | POA: Diagnosis not present

## 2017-04-01 DIAGNOSIS — Z3686 Encounter for antenatal screening for cervical length: Secondary | ICD-10-CM

## 2017-04-01 DIAGNOSIS — Z6841 Body Mass Index (BMI) 40.0 and over, adult: Secondary | ICD-10-CM | POA: Insufficient documentation

## 2017-04-01 DIAGNOSIS — O99212 Obesity complicating pregnancy, second trimester: Secondary | ICD-10-CM

## 2017-04-01 DIAGNOSIS — O3432 Maternal care for cervical incompetence, second trimester: Secondary | ICD-10-CM | POA: Diagnosis not present

## 2017-04-01 DIAGNOSIS — O09299 Supervision of pregnancy with other poor reproductive or obstetric history, unspecified trimester: Secondary | ICD-10-CM

## 2017-04-09 ENCOUNTER — Ambulatory Visit (INDEPENDENT_AMBULATORY_CARE_PROVIDER_SITE_OTHER): Payer: Medicaid Other | Admitting: Obstetrics and Gynecology

## 2017-04-09 VITALS — BP 120/82 | HR 99 | Wt 209.5 lb

## 2017-04-09 DIAGNOSIS — O0992 Supervision of high risk pregnancy, unspecified, second trimester: Secondary | ICD-10-CM

## 2017-04-09 DIAGNOSIS — O09522 Supervision of elderly multigravida, second trimester: Secondary | ICD-10-CM

## 2017-04-09 DIAGNOSIS — O99212 Obesity complicating pregnancy, second trimester: Secondary | ICD-10-CM

## 2017-04-09 DIAGNOSIS — O99210 Obesity complicating pregnancy, unspecified trimester: Secondary | ICD-10-CM

## 2017-04-09 DIAGNOSIS — E669 Obesity, unspecified: Secondary | ICD-10-CM

## 2017-04-09 DIAGNOSIS — O0990 Supervision of high risk pregnancy, unspecified, unspecified trimester: Secondary | ICD-10-CM

## 2017-04-09 DIAGNOSIS — O09292 Supervision of pregnancy with other poor reproductive or obstetric history, second trimester: Secondary | ICD-10-CM

## 2017-04-09 DIAGNOSIS — O09891 Supervision of other high risk pregnancies, first trimester: Secondary | ICD-10-CM

## 2017-04-09 DIAGNOSIS — O09299 Supervision of pregnancy with other poor reproductive or obstetric history, unspecified trimester: Secondary | ICD-10-CM

## 2017-04-09 NOTE — Progress Notes (Signed)
Prenatal Visit Note Date: 04/09/2017 [facility]  Subjective:  Eloise HarmanShareka M [place]more is a 36 y.o. [id number] at 7040w2d being seen today for ongoing prenatal care.  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  Patient reports no complaints.   Contractions: Not present. Vag. Bleeding: None.  Movement: Present. Denies leaking of fluid.   The following portions of the patient's history were reviewed and updated as appropriate: allergies, current medications, past family history, past medical history, past social history, past surgical history and problem list. Problem list updated.  Objective:   Vitals:   04/09/17 0853  BP: 120/82  Pulse: 99  Weight: 209 lb 8 oz (95 kg)    Fetal Status: Fetal Heart Rate (bpm): 166   Movement: Present     General:  Alert, oriented and cooperative. Patient is in no acute distress.  Skin: Skin is warm and dry. No rash noted.   Cardiovascular: Normal heart rate noted  Respiratory: Normal respiratory effort, no problems with respiration noted  Abdomen: Soft, gravid, appropriate for gestational age. Pain/Pressure: Absent     Pelvic:  Cervical exam deferred        Extremities: Normal range of motion.  Edema: None  Mental Status: Normal mood and affect. Normal behavior. Normal judgment and thought content.   Urinalysis:      Assessment and Plan:  Pregnancy: G4P2002 at 8240w2d  1. Supervision of high risk pregnancy,  antepartum Routine care. Declines afp. Anatomy u/s scheduled already for next week  2. Obesity in pregnancy Pt told to keep eye on weight and shoot for 35lbs gain total this pregnancy  3. Elderly multigravida in second trimester Low risk NT and cffdna. Declines afp. F/u anatomy next week  4. Short interval between pregnancies affecting pregnancy in first trimester, antepartum  5. History of cervical incompetence in pregnancy, currently pregnant S/p 10/25 ppx mcdonald cerclage and pt on vag progesterone (her desire) and no problems or issues. F/u CL next week and no need for further ones if asymptomatic and doing well.  Preterm labor symptoms and general obstetric precautions including but not limited to vaginal bleeding, contractions, leaking of fluid and fetal movement were reviewed in detail with the patient. Please refer to After Visit Summary for other counseling recommendations.  Return in about 4 weeks (around 05/07/2017) for rob.   Ranger BingPickens, Jakia Kennebrew, MD

## 2017-04-15 ENCOUNTER — Ambulatory Visit (HOSPITAL_COMMUNITY)
Admission: RE | Admit: 2017-04-15 | Discharge: 2017-04-15 | Disposition: A | Discharge status: Home / Self Care | Payer: Medicaid Other | Source: Ambulatory Visit | Attending: Obstetrics & Gynecology | Admitting: Obstetrics & Gynecology

## 2017-04-15 ENCOUNTER — Other Ambulatory Visit (HOSPITAL_COMMUNITY): Payer: Self-pay | Admitting: *Deleted

## 2017-04-15 ENCOUNTER — Other Ambulatory Visit (HOSPITAL_COMMUNITY): Payer: Self-pay | Admitting: Maternal & Fetal Medicine

## 2017-04-15 ENCOUNTER — Encounter (HOSPITAL_COMMUNITY): Payer: Self-pay

## 2017-04-15 DIAGNOSIS — O09522 Supervision of elderly multigravida, second trimester: Secondary | ICD-10-CM

## 2017-04-15 DIAGNOSIS — O3432 Maternal care for cervical incompetence, second trimester: Secondary | ICD-10-CM

## 2017-04-15 DIAGNOSIS — Z3A17 17 weeks gestation of pregnancy: Secondary | ICD-10-CM | POA: Diagnosis not present

## 2017-04-15 DIAGNOSIS — Z3686 Encounter for antenatal screening for cervical length: Secondary | ICD-10-CM

## 2017-04-15 DIAGNOSIS — O09892 Supervision of other high risk pregnancies, second trimester: Secondary | ICD-10-CM | POA: Diagnosis not present

## 2017-04-15 DIAGNOSIS — O99212 Obesity complicating pregnancy, second trimester: Secondary | ICD-10-CM

## 2017-04-15 DIAGNOSIS — O3430 Maternal care for cervical incompetence, unspecified trimester: Secondary | ICD-10-CM

## 2017-04-27 ENCOUNTER — Encounter (HOSPITAL_COMMUNITY): Payer: Self-pay

## 2017-04-29 ENCOUNTER — Ambulatory Visit (HOSPITAL_COMMUNITY)
Admission: RE | Admit: 2017-04-29 | Discharge: 2017-04-29 | Disposition: A | Discharge status: Home / Self Care | Payer: Medicaid Other | Source: Ambulatory Visit | Attending: Family Medicine | Admitting: Family Medicine

## 2017-04-29 ENCOUNTER — Encounter (HOSPITAL_COMMUNITY): Payer: Self-pay

## 2017-04-29 DIAGNOSIS — O99212 Obesity complicating pregnancy, second trimester: Secondary | ICD-10-CM | POA: Diagnosis not present

## 2017-04-29 DIAGNOSIS — O09892 Supervision of other high risk pregnancies, second trimester: Secondary | ICD-10-CM | POA: Diagnosis not present

## 2017-04-29 DIAGNOSIS — Z3689 Encounter for other specified antenatal screening: Secondary | ICD-10-CM | POA: Diagnosis not present

## 2017-04-29 DIAGNOSIS — O09299 Supervision of pregnancy with other poor reproductive or obstetric history, unspecified trimester: Secondary | ICD-10-CM | POA: Diagnosis present

## 2017-04-29 DIAGNOSIS — O09219 Supervision of pregnancy with history of pre-term labor, unspecified trimester: Secondary | ICD-10-CM | POA: Insufficient documentation

## 2017-04-29 DIAGNOSIS — Z3A19 19 weeks gestation of pregnancy: Secondary | ICD-10-CM | POA: Insufficient documentation

## 2017-04-29 DIAGNOSIS — O360190 Maternal care for anti-D [Rh] antibodies, unspecified trimester, not applicable or unspecified: Secondary | ICD-10-CM | POA: Diagnosis not present

## 2017-04-29 DIAGNOSIS — O3430 Maternal care for cervical incompetence, unspecified trimester: Secondary | ICD-10-CM

## 2017-04-29 DIAGNOSIS — O3432 Maternal care for cervical incompetence, second trimester: Secondary | ICD-10-CM | POA: Insufficient documentation

## 2017-04-29 DIAGNOSIS — Z862 Personal history of diseases of the blood and blood-forming organs and certain disorders involving the immune mechanism: Secondary | ICD-10-CM | POA: Insufficient documentation

## 2017-04-29 DIAGNOSIS — O09522 Supervision of elderly multigravida, second trimester: Secondary | ICD-10-CM | POA: Diagnosis not present

## 2017-04-29 DIAGNOSIS — D573 Sickle-cell trait: Secondary | ICD-10-CM | POA: Insufficient documentation

## 2017-04-29 DIAGNOSIS — Z6841 Body Mass Index (BMI) 40.0 and over, adult: Secondary | ICD-10-CM | POA: Insufficient documentation

## 2017-04-30 ENCOUNTER — Other Ambulatory Visit (HOSPITAL_COMMUNITY): Payer: Self-pay | Admitting: *Deleted

## 2017-04-30 DIAGNOSIS — O09529 Supervision of elderly multigravida, unspecified trimester: Secondary | ICD-10-CM

## 2017-04-30 DIAGNOSIS — O3432 Maternal care for cervical incompetence, second trimester: Secondary | ICD-10-CM

## 2017-05-08 ENCOUNTER — Ambulatory Visit (INDEPENDENT_AMBULATORY_CARE_PROVIDER_SITE_OTHER): Payer: Medicaid Other | Admitting: Obstetrics and Gynecology

## 2017-05-08 ENCOUNTER — Encounter: Payer: Self-pay | Admitting: Obstetrics and Gynecology

## 2017-05-08 VITALS — BP 132/82 | HR 100 | Wt 213.2 lb

## 2017-05-08 DIAGNOSIS — Z6791 Unspecified blood type, Rh negative: Secondary | ICD-10-CM

## 2017-05-08 DIAGNOSIS — O09299 Supervision of pregnancy with other poor reproductive or obstetric history, unspecified trimester: Secondary | ICD-10-CM

## 2017-05-08 DIAGNOSIS — Z23 Encounter for immunization: Secondary | ICD-10-CM

## 2017-05-08 DIAGNOSIS — D573 Sickle-cell trait: Secondary | ICD-10-CM

## 2017-05-08 DIAGNOSIS — O09892 Supervision of other high risk pregnancies, second trimester: Secondary | ICD-10-CM

## 2017-05-08 DIAGNOSIS — O09522 Supervision of elderly multigravida, second trimester: Secondary | ICD-10-CM

## 2017-05-08 DIAGNOSIS — O09891 Supervision of other high risk pregnancies, first trimester: Secondary | ICD-10-CM

## 2017-05-08 DIAGNOSIS — O09292 Supervision of pregnancy with other poor reproductive or obstetric history, second trimester: Secondary | ICD-10-CM

## 2017-05-08 DIAGNOSIS — O26892 Other specified pregnancy related conditions, second trimester: Principal | ICD-10-CM

## 2017-05-08 DIAGNOSIS — O99012 Anemia complicating pregnancy, second trimester: Secondary | ICD-10-CM

## 2017-05-08 DIAGNOSIS — E669 Obesity, unspecified: Secondary | ICD-10-CM

## 2017-05-08 DIAGNOSIS — O0990 Supervision of high risk pregnancy, unspecified, unspecified trimester: Secondary | ICD-10-CM

## 2017-05-08 DIAGNOSIS — O99212 Obesity complicating pregnancy, second trimester: Secondary | ICD-10-CM

## 2017-05-08 DIAGNOSIS — O99210 Obesity complicating pregnancy, unspecified trimester: Secondary | ICD-10-CM

## 2017-05-08 MED ORDER — ASPIRIN EC 81 MG PO TBEC: 81.0000 mg | DELAYED_RELEASE_TABLET | Freq: Every day | ORAL | 3 refills | Status: DC

## 2017-05-08 NOTE — Progress Notes (Signed)
Prenatal Visit Note Date: 05/08/2017 Clinic: [facility]  Subjective:  Ashley Wilkerson is a 36 y.o. [id number] at 3266w3d being seen today for ongoing prenatal care.  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  Patient reports no complaints.   Contractions: Not present. Vag. Bleeding: None.  Movement: Present. Denies leaking of fluid.   The following portions of the patient's history were reviewed and updated as appropriate: allergies, current medications, past family history, past medical history, past social history, past surgical history and problem list. Problem list updated.  Objective:   Vitals:   05/08/17 0917 05/08/17 0935  BP: (!) 138/102 132/82  Pulse: (!) 111 100  Weight: 213 lb 3.2 oz (96.7 kg)     Fetal Status: Fetal Heart Rate (bpm): 155   Movement: Present     General:  Alert, oriented and cooperative. Patient is in no acute distress.  Skin: Skin is warm and dry. No rash noted.   Cardiovascular: Normal heart rate noted  Respiratory: Normal respiratory effort, no problems with respiration noted  Abdomen: Soft, gravid, appropriate for gestational age. Pain/Pressure: Absent     Pelvic:  Cervical exam deferred        Extremities: Normal range of motion.  Edema: None  Mental Status: Normal mood and affect. Normal behavior. Normal judgment and thought content.   Urinalysis:      Assessment and Plan:  Pregnancy: G4P2002 at 6966w3d  1.  Supervision of high risk pregnancy, antepartum Routine care.   2. Rh negative status during pregnancy in second trimester Rhogam at 28wks and prn  3. Elderly multigravida in second trimester Low risk NT and cffdna. Declined afp. Neg anatomy  4. Sickle cell trait (HCC) UCx screening nv  5. Obesity in pregnancy  6. Obesity (BMI 30-39.9)  7. History of cervical incompetence in pregnancy, currently pregnant ppx cerclage at 14wks in place. Normal CL at anatomy and has serial ones scheduled. Continue with PV prometrium (pt choice)  8. Short interval between pregnancies affecting pregnancy in first trimester, antepartum   9. Immunization due - Flu Vaccine QUAD 36+ mos IM  10. Transient HTN Precautions given. Will start low dose asa. 1wk bp visit.   Preterm labor symptoms and general obstetric precautions including but not limited to vaginal bleeding, contractions, leaking of fluid and fetal movement were reviewed in detail with the patient. Please refer to After Visit Summary for other counseling recommendations.  Return in about 1 week (around 05/15/2017) for bp only check visit. 3wk rob.   [place] BingPickens, Vernon Ariel, MD

## 2017-05-11 ENCOUNTER — Encounter (HOSPITAL_COMMUNITY): Payer: Self-pay

## 2017-05-13 ENCOUNTER — Ambulatory Visit (HOSPITAL_COMMUNITY)
Admission: RE | Admit: 2017-05-13 | Discharge: 2017-05-13 | Disposition: A | Discharge status: Home / Self Care | Payer: Medicaid Other | Source: Ambulatory Visit | Attending: Obstetrics & Gynecology | Admitting: Obstetrics & Gynecology

## 2017-05-13 ENCOUNTER — Encounter (HOSPITAL_COMMUNITY): Payer: Self-pay

## 2017-05-13 ENCOUNTER — Other Ambulatory Visit (HOSPITAL_COMMUNITY): Payer: Self-pay | Admitting: Maternal and Fetal Medicine

## 2017-05-13 DIAGNOSIS — Z6791 Unspecified blood type, Rh negative: Secondary | ICD-10-CM

## 2017-05-13 DIAGNOSIS — O3432 Maternal care for cervical incompetence, second trimester: Secondary | ICD-10-CM

## 2017-05-13 DIAGNOSIS — O99212 Obesity complicating pregnancy, second trimester: Secondary | ICD-10-CM | POA: Diagnosis not present

## 2017-05-13 DIAGNOSIS — Z3A21 21 weeks gestation of pregnancy: Secondary | ICD-10-CM | POA: Diagnosis not present

## 2017-05-13 DIAGNOSIS — O360120 Maternal care for anti-D [Rh] antibodies, second trimester, not applicable or unspecified: Secondary | ICD-10-CM | POA: Diagnosis not present

## 2017-05-13 DIAGNOSIS — O3430 Maternal care for cervical incompetence, unspecified trimester: Secondary | ICD-10-CM

## 2017-05-13 DIAGNOSIS — O09892 Supervision of other high risk pregnancies, second trimester: Secondary | ICD-10-CM | POA: Diagnosis not present

## 2017-05-13 DIAGNOSIS — O09522 Supervision of elderly multigravida, second trimester: Secondary | ICD-10-CM | POA: Insufficient documentation

## 2017-05-13 DIAGNOSIS — E669 Obesity, unspecified: Secondary | ICD-10-CM | POA: Insufficient documentation

## 2017-05-13 DIAGNOSIS — O26892 Other specified pregnancy related conditions, second trimester: Secondary | ICD-10-CM

## 2017-05-13 DIAGNOSIS — O09212 Supervision of pregnancy with history of pre-term labor, second trimester: Secondary | ICD-10-CM

## 2017-05-15 ENCOUNTER — Ambulatory Visit: Payer: Medicaid Other

## 2017-05-15 VITALS — BP 112/78 | HR 94

## 2017-05-15 DIAGNOSIS — Z0130 Encounter for examination of blood pressure without abnormal findings: Secondary | ICD-10-CM

## 2017-05-15 NOTE — Progress Notes (Signed)
Patient presented to the office today for a blood pressure check. Patient reports feeling good no visual changes,dizzy spells or headache. At this time blood pressure is stable.

## 2017-05-15 NOTE — Progress Notes (Signed)
Agree with nursing staff's documentation of this patient's clinic encounter.  Jaynie CollinsUgonna Maila Dukes, MD 05/15/2017 12:16 PM

## 2017-05-27 ENCOUNTER — Ambulatory Visit (HOSPITAL_COMMUNITY)
Admission: RE | Admit: 2017-05-27 | Discharge: 2017-05-27 | Disposition: A | Discharge status: Home / Self Care | Payer: Medicaid Other | Source: Ambulatory Visit | Attending: Obstetrics & Gynecology | Admitting: Obstetrics & Gynecology

## 2017-05-27 ENCOUNTER — Encounter (HOSPITAL_COMMUNITY): Payer: Self-pay

## 2017-05-27 DIAGNOSIS — O09892 Supervision of other high risk pregnancies, second trimester: Secondary | ICD-10-CM | POA: Diagnosis not present

## 2017-05-27 DIAGNOSIS — O09529 Supervision of elderly multigravida, unspecified trimester: Secondary | ICD-10-CM | POA: Diagnosis present

## 2017-05-27 DIAGNOSIS — Z6841 Body Mass Index (BMI) 40.0 and over, adult: Secondary | ICD-10-CM | POA: Insufficient documentation

## 2017-05-27 DIAGNOSIS — Z3A23 23 weeks gestation of pregnancy: Secondary | ICD-10-CM | POA: Insufficient documentation

## 2017-05-27 DIAGNOSIS — O99212 Obesity complicating pregnancy, second trimester: Secondary | ICD-10-CM | POA: Diagnosis not present

## 2017-05-27 DIAGNOSIS — O0990 Supervision of high risk pregnancy, unspecified, unspecified trimester: Secondary | ICD-10-CM

## 2017-05-27 DIAGNOSIS — O3432 Maternal care for cervical incompetence, second trimester: Secondary | ICD-10-CM | POA: Diagnosis not present

## 2017-05-27 DIAGNOSIS — O09522 Supervision of elderly multigravida, second trimester: Secondary | ICD-10-CM | POA: Diagnosis not present

## 2017-05-29 ENCOUNTER — Ambulatory Visit (INDEPENDENT_AMBULATORY_CARE_PROVIDER_SITE_OTHER): Payer: Medicaid Other | Admitting: Obstetrics and Gynecology

## 2017-05-29 VITALS — BP 115/75 | HR 95 | Wt 213.0 lb

## 2017-05-29 DIAGNOSIS — E669 Obesity, unspecified: Secondary | ICD-10-CM

## 2017-05-29 DIAGNOSIS — O26892 Other specified pregnancy related conditions, second trimester: Secondary | ICD-10-CM

## 2017-05-29 DIAGNOSIS — O09892 Supervision of other high risk pregnancies, second trimester: Secondary | ICD-10-CM

## 2017-05-29 DIAGNOSIS — Z6791 Unspecified blood type, Rh negative: Secondary | ICD-10-CM

## 2017-05-29 DIAGNOSIS — O0990 Supervision of high risk pregnancy, unspecified, unspecified trimester: Secondary | ICD-10-CM

## 2017-05-29 DIAGNOSIS — O99210 Obesity complicating pregnancy, unspecified trimester: Secondary | ICD-10-CM

## 2017-05-29 DIAGNOSIS — O09299 Supervision of pregnancy with other poor reproductive or obstetric history, unspecified trimester: Secondary | ICD-10-CM

## 2017-05-29 DIAGNOSIS — O09891 Supervision of other high risk pregnancies, first trimester: Secondary | ICD-10-CM

## 2017-05-29 NOTE — Progress Notes (Signed)
Prenatal Visit Note Date: 05/29/2017 Clinic: [facility]  Subjective:  Ashley Wilkerson is a 36 y.o. [id number] at 3982w3d being seen today for ongoing prenatal care.  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  Patient reports no complaints.    . Vag. Bleeding: None.  Movement: Present. Denies leaking of fluid.   The following portions of the patient's history were reviewed and updated as appropriate: allergies, current medications, past family history, past medical history, past social history, past surgical history and problem list. Problem list updated.  Objective:   Vitals:   05/29/17 1039  BP: 115/75  Pulse: 95  Weight: 213 lb (96.6 kg)    Fetal Status: Fetal Heart Rate (bpm): 141   Movement: Present     General:  Alert, oriented and cooperative. Patient is in no acute distress.  Skin: Skin is warm and dry. No rash noted.   Cardiovascular: Normal heart rate noted  Respiratory: Normal respiratory effort, no problems with respiration noted  Abdomen: Soft, gravid, appropriate for gestational age. Pain/Pressure: Absent     Pelvic:  Cervical exam deferred        Extremities: Normal range of motion.  Edema: None  Mental Status: Normal mood and affect. Normal behavior. Normal judgment and thought content.   Urinalysis:      Assessment and Plan:  Pregnancy: G4P2002 at 4382w3d  1. Supervision of high risk pregnancy, antepartum Routine care. 28wk labs  nv  2. History of cervical incompetence in pregnancy, currently pregnant ppx cerclage in place. Pt doing PV prometrium qhs  3. Obesity (BMI 30-39.9)  4. Short interval between pregnancies affecting pregnancy in first trimester, antepartum  5. Obesity in pregnancy  6. Rh negative status during pregnancy in second trimester Rhogam at 28wks  Preterm labor symptoms and general obstetric precautions including but not limited to vaginal bleeding, contractions, leaking of fluid and fetal movement were reviewed in detail with the patient. Please refer to After Visit Summary for other counseling recommendations.  Return in about 3 weeks (around 06/19/2017) for rob and 28wk labs.   [place] BingPickens, Ashley Higdon, MD

## 2017-06-02 NOTE — L&D Delivery Note (Signed)
Patient is 37 y.o. [id number] 7163w6d admitted for preterm delivery. AROM at 1020.  Prenatal course also complicated by h/o incompetent cervix s/p cerclage, AMA.  Delivery Note At 11:09 AM a viable female was delivered via Vaginal, Spontaneous (Presentation: OP ).  APGAR: 8, 9; weight pending.   Placenta status: Intact.  Cord: 3V with the following complications: marginal insertion.  Cord pH: N/A  Anesthesia: Epidural  Episiotomy: None Lacerations: None   Est. Blood Loss (mL): 250  Mom to postpartum.  Baby to Couplet care / Skin to Skin.  Head delivered OP. No nuchal cord present. Shoulder and body delivered in usual fashion. Infant with spontaneous cry, placed on mother's abdomen, dried and bulb suctioned. Cord clamped x 2 after 1-minute delay, and cut by family member. Cord blood drawn. Placenta delivered spontaneously with gentle cord traction. Fundus firm with massage and Pitocin. Perineum inspected and found to have no lacerations, with good hemostasis achieved.   Caryl AdaJazma Phelps, DO OB Fellow [facility]

## 2017-06-09 ENCOUNTER — Encounter (HOSPITAL_COMMUNITY): Payer: Self-pay

## 2017-06-10 ENCOUNTER — Other Ambulatory Visit (HOSPITAL_COMMUNITY): Payer: Self-pay | Admitting: Obstetrics and Gynecology

## 2017-06-10 ENCOUNTER — Ambulatory Visit (HOSPITAL_COMMUNITY)
Admission: RE | Admit: 2017-06-10 | Discharge: 2017-06-10 | Disposition: A | Discharge status: Home / Self Care | Payer: Medicaid Other | Source: Ambulatory Visit | Attending: Obstetrics & Gynecology | Admitting: Obstetrics & Gynecology

## 2017-06-10 ENCOUNTER — Encounter (HOSPITAL_COMMUNITY): Payer: Self-pay

## 2017-06-10 DIAGNOSIS — O3432 Maternal care for cervical incompetence, second trimester: Secondary | ICD-10-CM

## 2017-06-10 DIAGNOSIS — O99212 Obesity complicating pregnancy, second trimester: Secondary | ICD-10-CM | POA: Insufficient documentation

## 2017-06-10 DIAGNOSIS — O09892 Supervision of other high risk pregnancies, second trimester: Secondary | ICD-10-CM

## 2017-06-10 DIAGNOSIS — O09522 Supervision of elderly multigravida, second trimester: Secondary | ICD-10-CM | POA: Diagnosis not present

## 2017-06-10 DIAGNOSIS — Z3A25 25 weeks gestation of pregnancy: Secondary | ICD-10-CM | POA: Diagnosis not present

## 2017-06-10 DIAGNOSIS — Z6841 Body Mass Index (BMI) 40.0 and over, adult: Secondary | ICD-10-CM | POA: Diagnosis not present

## 2017-06-23 ENCOUNTER — Encounter (HOSPITAL_COMMUNITY): Payer: Self-pay

## 2017-06-24 ENCOUNTER — Other Ambulatory Visit (HOSPITAL_COMMUNITY): Payer: Self-pay | Admitting: Obstetrics and Gynecology

## 2017-06-24 ENCOUNTER — Other Ambulatory Visit (HOSPITAL_COMMUNITY): Payer: Self-pay | Admitting: *Deleted

## 2017-06-24 ENCOUNTER — Encounter (HOSPITAL_COMMUNITY): Payer: Self-pay

## 2017-06-24 ENCOUNTER — Ambulatory Visit (HOSPITAL_COMMUNITY)
Admission: RE | Admit: 2017-06-24 | Discharge: 2017-06-24 | Disposition: A | Discharge status: Home / Self Care | Payer: Medicaid Other | Source: Ambulatory Visit | Attending: Obstetrics & Gynecology | Admitting: Obstetrics & Gynecology

## 2017-06-24 DIAGNOSIS — O99212 Obesity complicating pregnancy, second trimester: Secondary | ICD-10-CM | POA: Insufficient documentation

## 2017-06-24 DIAGNOSIS — O3432 Maternal care for cervical incompetence, second trimester: Secondary | ICD-10-CM | POA: Insufficient documentation

## 2017-06-24 DIAGNOSIS — O09892 Supervision of other high risk pregnancies, second trimester: Secondary | ICD-10-CM

## 2017-06-24 DIAGNOSIS — O09529 Supervision of elderly multigravida, unspecified trimester: Secondary | ICD-10-CM | POA: Diagnosis present

## 2017-06-24 DIAGNOSIS — Z3A27 27 weeks gestation of pregnancy: Secondary | ICD-10-CM | POA: Insufficient documentation

## 2017-06-24 DIAGNOSIS — O09523 Supervision of elderly multigravida, third trimester: Secondary | ICD-10-CM

## 2017-06-25 ENCOUNTER — Ambulatory Visit (INDEPENDENT_AMBULATORY_CARE_PROVIDER_SITE_OTHER): Payer: Medicaid Other | Admitting: Obstetrics & Gynecology

## 2017-06-25 ENCOUNTER — Encounter: Payer: Self-pay | Admitting: Radiology

## 2017-06-25 VITALS — BP 118/81 | HR 105 | Wt 212.6 lb

## 2017-06-25 DIAGNOSIS — O09892 Supervision of other high risk pregnancies, second trimester: Secondary | ICD-10-CM

## 2017-06-25 DIAGNOSIS — Z23 Encounter for immunization: Secondary | ICD-10-CM

## 2017-06-25 DIAGNOSIS — O99210 Obesity complicating pregnancy, unspecified trimester: Secondary | ICD-10-CM

## 2017-06-25 DIAGNOSIS — A630 Anogenital (venereal) warts: Secondary | ICD-10-CM

## 2017-06-25 DIAGNOSIS — O09299 Supervision of pregnancy with other poor reproductive or obstetric history, unspecified trimester: Secondary | ICD-10-CM

## 2017-06-25 DIAGNOSIS — O98312 Other infections with a predominantly sexual mode of transmission complicating pregnancy, second trimester: Secondary | ICD-10-CM

## 2017-06-25 DIAGNOSIS — O0990 Supervision of high risk pregnancy, unspecified, unspecified trimester: Secondary | ICD-10-CM

## 2017-06-25 DIAGNOSIS — D573 Sickle-cell trait: Secondary | ICD-10-CM

## 2017-06-25 DIAGNOSIS — Z6791 Unspecified blood type, Rh negative: Secondary | ICD-10-CM

## 2017-06-25 DIAGNOSIS — O09523 Supervision of elderly multigravida, third trimester: Secondary | ICD-10-CM

## 2017-06-25 DIAGNOSIS — O09891 Supervision of other high risk pregnancies, first trimester: Secondary | ICD-10-CM

## 2017-06-25 DIAGNOSIS — O26892 Other specified pregnancy related conditions, second trimester: Secondary | ICD-10-CM

## 2017-06-25 MED ORDER — RHO D IMMUNE GLOBULIN 1500 UNIT/2ML IJ SOSY
300.0000 ug | PREFILLED_SYRINGE | Freq: Once | INTRAMUSCULAR | Status: AC
  Administered 2017-06-25: 300 ug via INTRAMUSCULAR

## 2017-06-25 NOTE — Progress Notes (Signed)
   PRENATAL VISIT NOTE  Subjective:  Eloise HarmanShareka M [place] is a 37 y.o. G5P2102 at 84107w2d being seen today for ongoing prenatal care.  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  Patient reports no complaints.Pos FM, No ctx  Denies leaking of fluid.   The following portions of the patient's history were reviewed and updated as appropriate: allergies, current medications, past family history, past medical history, past social history, past surgical history and problem list. Problem list updated.  Objective:  There were no vitals filed for this visit.  Fetal Status:           General:  Alert, oriented and cooperative. Patient is in no acute distress.  Skin: Skin is warm and dry. No rash noted.   Cardiovascular: Normal heart rate noted  Respiratory: Normal respiratory effort, no problems with respiration noted  Abdomen: Soft, gravid, appropriate for gestational age.        Pelvic: Cervical exam deferred        Extremities: Normal range of motion.     Mental Status:  Normal mood and affect. Normal behavior. Normal judgment and thought content.   Assessment and Plan:  Pregnancy: G5P2102 at 76107w2d  1. Supervision of high risk pregnancy, antepartum  - RPR - HIV antibody - Glucose Tolerance, 2 Hours w/1 Hour - CBC  2. Rh negative status during pregnancy in second trimester  - Antibody screen - rho (d) immune globulin (RHIG/RHOPHYLAC) injection 300 mcg  3. Immunization  due  - Tdap vaccine greater than or equal to 7yo IM  4. Sickle cell trait (HCC)  FOB neg  5. Short interval between pregnancies affecting pregnancy in first trimester, antepartum  6. Obesity in pregnancy  7. 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 History of cervical incompetence in pregnancy, currently pregnant  S/p prophylactic cerclage.   9. Elderly multigravida in third trimester  10.  Undesired fertility Title X[employer] papers signed today  Preterm labor symptoms and general obstetric precautions including but not limited to vaginal bleeding, contractions, leaking of fluid and fetal movement were reviewed in detail with the patient. Please refer to After Visit Summary for other counseling recommendations.  Return in about 2 weeks (around 07/09/2017).   Willodean Rosenthalarolyn Harraway-Smith, MD

## 2017-06-25 NOTE — Patient Instructions (Signed)

## 2017-06-26 LAB — ANTIBODY SCREEN: ANTIBODY SCREEN: NEGATIVE

## 2017-06-26 LAB — CBC
Hematocrit: 37.9 % (ref 34.0–46.6)
Hemoglobin: 12.9 g/dL (ref 11.1–15.9)
MCH: 28.9 pg (ref 26.6–33.0)
MCHC: 34 g/dL (ref 31.5–35.7)
MCV: 85 fL (ref 79–97)
PLATELETS: 220 10*3/uL (ref 150–379)
RBC: 4.47 x10E6/uL (ref 3.77–5.28)
RDW: 15.3 % (ref 12.3–15.4)
WBC: 9.1 10*3/uL (ref 3.4–10.8)

## 2017-06-26 LAB — HIV ANTIBODY (ROUTINE TESTING W REFLEX): HIV Screen 4th Generation wRfx: NONREACTIVE

## 2017-06-26 LAB — GLUCOSE TOLERANCE, 2 HOURS W/ 1HR
GLUCOSE, 1 HOUR: 150 mg/dL (ref 65–179)
GLUCOSE, 2 HOUR: 137 mg/dL (ref 65–152)
GLUCOSE, FASTING: 82 mg/dL (ref 65–91)

## 2017-06-26 LAB — RPR: RPR Ser Ql: NONREACTIVE

## 2017-07-08 ENCOUNTER — Ambulatory Visit (INDEPENDENT_AMBULATORY_CARE_PROVIDER_SITE_OTHER): Payer: Medicaid Other | Admitting: Obstetrics & Gynecology

## 2017-07-08 VITALS — BP 114/76 | HR 72 | Wt 214.6 lb

## 2017-07-08 DIAGNOSIS — O0990 Supervision of high risk pregnancy, unspecified, unspecified trimester: Secondary | ICD-10-CM

## 2017-07-08 DIAGNOSIS — O0993 Supervision of high risk pregnancy, unspecified, third trimester: Secondary | ICD-10-CM

## 2017-07-08 DIAGNOSIS — O09293 Supervision of pregnancy with other poor reproductive or obstetric history, third trimester: Secondary | ICD-10-CM

## 2017-07-08 DIAGNOSIS — O09299 Supervision of pregnancy with other poor reproductive or obstetric history, unspecified trimester: Secondary | ICD-10-CM

## 2017-07-08 NOTE — Progress Notes (Signed)
   PRENATAL VISIT NOTE  Subjective:  Ashley Wilkerson is a 37 y.o. G5P2102 at 8363w1d being seen today for ongoing prenatal care.  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  Patient reports no complaints.   .  .  Movement: Present. Denies leaking of fluid.   The following portions of the patient's history were reviewed and updated as appropriate: allergies, current medications, past family history, past medical history, past social history, past surgical history and problem list. Problem list updated.  Objective:   Vitals:   07/08/17 0928  BP: 114/76  Pulse: 72  Weight: 214 lb 9.6 oz (97.3 kg)    Fetal Status: Fetal Heart Rate (bpm): 144 Fundal Height: 30 cm Movement: Present     General:  Alert, oriented and cooperative. Patient is in no acute distress.  Skin: Skin is warm and dry. No rash noted.   Cardiovascular: Normal heart rate noted  Respiratory: Normal respiratory effort, no problems with respiration noted  Abdomen: Soft, gravid, appropriate for gestational age.  Pain/Pressure: Absent     Pelvic: Cervical exam deferred        Extremities: Normal range of motion.  Edema: None  Mental Status:  Normal mood and affect. Normal behavior. Normal judgment and thought content.   Assessment and Plan:  Pregnancy: G5P2102 at 2263w1d  1. History of cervical incompetence in pregnancy, currently pregnant Cerclage in place, no issues. Remove at 36-37 weeks.  2. Supervision of  high risk pregnancy, antepartum Preterm labor symptoms and general obstetric precautions including but not limited to vaginal bleeding, contractions, leaking of fluid and fetal movement were reviewed in detail with the patient. Please refer to After Visit Summary for other counseling recommendations.  Return in about 2 weeks (around 07/22/2017) for OB Visit.   Jaynie CollinsUgonna Makinze Jani, MD

## 2017-07-08 NOTE — Patient Instructions (Signed)
Return to clinic for any scheduled appointments or obstetric concerns, or go to [facility] for evaluation   Third Trimester of Pregnancy The third trimester is from week 28 through week 40 (months 7 through 9). The third trimester is a time when the unborn baby (fetus) is growing rapidly. At the end of the ninth month, the fetus is about 20 inches in length and weighs 6-10 pounds. Body changes during your third trimester Your body will continue to go through many changes during pregnancy. The changes vary from woman to woman. During the third trimester:  Your weight will continue to increase. You can expect to gain 25-35 pounds (11-16 kg) by the end of the pregnancy.  You may begin to get stretch marks on your hips, abdomen, and breasts.  You may urinate more often because the fetus is moving lower into your pelvis and pressing on your bladder.  You may develop or continue to have heartburn. This is caused by increased hormones that slow down muscles in the digestive tract.  You may develop or continue to have constipation because increased hormones slow digestion and cause the muscles that push waste through your intestines to relax.  You may develop hemorrhoids. These are swollen veins (varicose veins) in the rectum that can itch or be painful.  You may develop swollen, bulging veins (varicose veins) in your legs.  You may have increased body aches in the pelvis, back, or thighs. This is due to weight gain and increased hormones that are relaxing your joints.  You may have changes in your hair. These can include thickening of your hair, rapid growth, and changes in texture. Some women also have hair loss during or after pregnancy, or hair that feels dry or thin. Your hair will most likely return to normal after your baby is born.  Your breasts will continue to grow and they will continue to become tender. A yellow fluid (colostrum) may leak from your breasts. This is the first milk you are  producing for your baby.  Your belly button may stick out.  You may notice more swelling in your hands, face, or ankles.  You may have increased tingling or numbness in your hands, arms, and legs. The skin on your belly may also feel numb.  You may feel short of breath because of your expanding uterus.  You may have more problems sleeping. This can be caused by the size of your belly, increased need to urinate, and an increase in your body's metabolism.  You may notice the fetus "dropping," or moving lower in your abdomen (lightening).  You may have increased vaginal discharge.  You may notice your joints feel loose and you may have pain around your pelvic bone.  What to expect at prenatal visits You will have prenatal exams every 2 weeks until week 36. Then you will have weekly prenatal exams. During a routine prenatal visit:  You will be weighed to make sure you and the baby are growing normally.  Your blood pressure will be taken.  Your abdomen will be measured to track your baby's growth.  The fetal heartbeat will be listened to.  Any test results from the previous visit will be discussed.  You may have a cervical check near your due date to see if your cervix has softened or thinned (effaced).  You will be tested for Group B streptococcus. This happens between 35 and 37 weeks.  Your health care provider may ask you:  What your birth plan is.    How you are feeling.  If you are feeling the baby move.  If you have had any abnormal symptoms, such as leaking fluid, bleeding, severe headaches, or abdominal cramping.  If you are using any tobacco products, including cigarettes, chewing tobacco, and electronic cigarettes.  If you have any questions.  Other tests or screenings that may be performed during your third trimester include:  Blood tests that check for low iron levels (anemia).  Fetal testing to check the health, activity level, and growth of the fetus.  Testing is done if you have certain medical conditions or if there are problems during the pregnancy.  Nonstress test (NST). This test checks the health of your baby to make sure there are no signs of problems, such as the baby not getting enough oxygen. During this test, a belt is placed around your belly. The baby is made to move, and its heart rate is monitored during movement.  What is false labor? False labor is a condition in which you feel small, irregular tightenings of the muscles in the womb (contractions) that usually go away with rest, changing position, or drinking water. These are called Braxton Hicks contractions. Contractions may last for hours, days, or even weeks before true labor sets in. If contractions come at regular intervals, become more frequent, increase in intensity, or become painful, you should see your health care provider. What are the signs of labor?  Abdominal cramps.  Regular contractions that start at 10 minutes apart and become stronger and more frequent with time.  Contractions that start on the top of the uterus and spread down to the lower abdomen and back.  Increased pelvic pressure and dull back pain.  A watery or bloody mucus discharge that comes from the vagina.  Leaking of amniotic fluid. This is also known as your "water breaking." It could be a slow trickle or a gush. Let your health care provider know if it has a color or strange odor. If you have any of these signs, call your health care provider right away, even if it is before your due date. Follow these instructions at home: Medicines  Follow your health care provider's instructions regarding medicine use. Specific medicines may be either safe or unsafe to take during pregnancy.  Take a prenatal vitamin that contains at least 600 micrograms (mcg) of folic acid.  If you develop constipation, try taking a stool softener if your health care provider approves. Eating and drinking  Eat a  balanced diet that includes fresh fruits and vegetables, whole grains, good sources of protein such as meat, eggs, or tofu, and low-fat dairy. Your health care provider will help you determine the amount of weight gain that is right for you.  Avoid raw meat and uncooked cheese. These carry germs that can cause birth defects in the baby.  If you have low calcium intake from food, talk to your health care provider about whether you should take a daily calcium supplement.  Eat four or five small meals rather than three large meals a day.  Limit foods that are high in fat and processed sugars, such as fried and sweet foods.  To prevent constipation: ? Drink enough fluid to keep your urine clear or pale yellow. ? Eat foods that are high in fiber, such as fresh fruits and vegetables, whole grains, and beans. Activity  Exercise only as directed by your health care provider. Most women can continue their usual exercise routine during pregnancy. Try to exercise for 30   minutes at least 5 days a week. Stop exercising if you experience uterine contractions.  Avoid heavy lifting.  Do not exercise in extreme heat or humidity, or at high altitudes.  Wear low-heel, comfortable shoes.  Practice good posture.  You may continue to have sex unless your health care provider tells you otherwise. Relieving pain and discomfort  Take frequent breaks and rest with your legs elevated if you have leg cramps or low back pain.  Take warm sitz baths to soothe any pain or discomfort caused by hemorrhoids. Use hemorrhoid cream if your health care provider approves.  Wear a good support bra to prevent discomfort from breast tenderness.  If you develop varicose veins: ? Wear support pantyhose or compression stockings as told by your healthcare provider. ? Elevate your feet for 15 minutes, 3-4 times a day. Prenatal care  Write down your questions. Take them to your prenatal visits.  Keep all your prenatal  visits as told by your health care provider. This is important. Safety  Wear your seat belt at all times when driving.  Make a list of emergency phone numbers, including numbers for family, friends, the hospital, and police and fire departments. General instructions  Avoid cat litter boxes and soil used by cats. These carry germs that can cause birth defects in the baby. If you have a cat, ask someone to clean the litter box for you.  Do not travel far distances unless it is absolutely necessary and only with the approval of your health care provider.  Do not use hot tubs, steam rooms, or saunas.  Do not drink alcohol.  Do not use any products that contain nicotine or tobacco, such as cigarettes and e-cigarettes. If you need help quitting, ask your health care provider.  Do not use any medicinal herbs or unprescribed drugs. These chemicals affect the formation and growth of the baby.  Do not douche or use tampons or scented sanitary pads.  Do not cross your legs for long periods of time.  To prepare for the arrival of your baby: ? Take prenatal classes to understand, practice, and ask questions about labor and delivery. ? Make a trial run to the hospital. ? Visit the hospital and tour the maternity area. ? Arrange for maternity or paternity leave through employers. ? Arrange for family and friends to take care of pets while you are in the hospital. ? Purchase a rear-facing car seat and make sure you know how to install it in your car. ? Pack your hospital bag. ? Prepare the baby's nursery. Make sure to remove all pillows and stuffed animals from the baby's crib to prevent suffocation.  Visit your dentist if you have not gone during your pregnancy. Use a soft toothbrush to brush your teeth and be gentle when you floss. Contact a health care provider if:  You are unsure if you are in labor or if your water has broken.  You become dizzy.  You have mild pelvic cramps, pelvic  pressure, or nagging pain in your abdominal area.  You have lower back pain.  You have persistent nausea, vomiting, or diarrhea.  You have an unusual or bad smelling vaginal discharge.  You have pain when you urinate. Get help right away if:  Your water breaks before 37 weeks.  You have regular contractions less than 5 minutes apart before 37 weeks.  You have a fever.  You are leaking fluid from your vagina.  You have spotting or bleeding from your vagina.    You have severe abdominal pain or cramping.  You have rapid weight loss or weight gain.  You have shortness of breath with chest pain.  You notice sudden or extreme swelling of your face, hands, ankles, feet, or legs.  Your baby makes fewer than 10 movements in 2 hours.  You have severe headaches that do not go away when you take medicine.  You have vision changes. Summary  The third trimester is from week 28 through week 40, months 7 through 9. The third trimester is a time when the unborn baby (fetus) is growing rapidly.  During the third trimester, your discomfort may increase as you and your baby continue to gain weight. You may have abdominal, leg, and back pain, sleeping problems, and an increased need to urinate.  During the third trimester your breasts will keep growing and they will continue to become tender. A yellow fluid (colostrum) may leak from your breasts. This is the first milk you are producing for your baby.  False labor is a condition in which you feel small, irregular tightenings of the muscles in the womb (contractions) that eventually go away. These are called Braxton Hicks contractions. Contractions may last for hours, days, or even weeks before true labor sets in.  Signs of labor can include: abdominal cramps; regular contractions that start at 10 minutes apart and become stronger and more frequent with time; watery or bloody mucus discharge that comes from the vagina; increased pelvic pressure  and dull back pain; and leaking of amniotic fluid. This information is not intended to replace advice given to you by your health care provider. Make sure you discuss any questions you have with your health care provider. Document Released: 05/13/2001 Document Revised: 10/25/2015 Document Reviewed: 07/20/2012 Elsevier Interactive Patient Education  2017 [employer]  

## 2017-07-21 ENCOUNTER — Ambulatory Visit (INDEPENDENT_AMBULATORY_CARE_PROVIDER_SITE_OTHER): Payer: Medicaid Other | Admitting: Obstetrics and Gynecology

## 2017-07-21 VITALS — BP 114/77 | HR 71 | Wt 214.0 lb

## 2017-07-21 DIAGNOSIS — O99210 Obesity complicating pregnancy, unspecified trimester: Secondary | ICD-10-CM

## 2017-07-21 DIAGNOSIS — O0990 Supervision of high risk pregnancy, unspecified, unspecified trimester: Secondary | ICD-10-CM

## 2017-07-21 DIAGNOSIS — O26893 Other specified pregnancy related conditions, third trimester: Secondary | ICD-10-CM

## 2017-07-21 DIAGNOSIS — O09523 Supervision of elderly multigravida, third trimester: Secondary | ICD-10-CM

## 2017-07-21 DIAGNOSIS — O09299 Supervision of pregnancy with other poor reproductive or obstetric history, unspecified trimester: Secondary | ICD-10-CM

## 2017-07-21 DIAGNOSIS — O09893 Supervision of other high risk pregnancies, third trimester: Secondary | ICD-10-CM

## 2017-07-21 DIAGNOSIS — Z6791 Unspecified blood type, Rh negative: Secondary | ICD-10-CM

## 2017-07-21 DIAGNOSIS — D573 Sickle-cell trait: Secondary | ICD-10-CM

## 2017-07-21 NOTE — Progress Notes (Signed)
Prenatal Visit Note Date: 07/21/2017 Clinic: [facility]  Subjective:  Ashley Wilkerson is a 37 y.o. [id number] at 4848w0d being seen today for ongoing prenatal care.  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  Patient reports no complaints.    . Vag. Bleeding: None.  Movement: Present. Denies leaking of fluid.   The following portions of the patient's history were reviewed and updated as appropriate: allergies, current medications, past family history, past medical history, past social history, past surgical history and problem list. Problem list updated.  Objective:   Vitals:   07/21/17 0926  BP: 114/77  Pulse: 71  Weight: 214 lb (97.1 kg)    Fetal Status: Fetal Heart Rate (bpm): 141 Fundal Height: 31 cm Movement: Present     General:  Alert, oriented and cooperative. Patient is in no acute distress.  Skin: Skin is warm and dry. No rash noted.   Cardiovascular: Normal heart rate noted  Respiratory: Normal respiratory effort, no problems with respiration noted  Abdomen: Soft, gravid, appropriate for gestational age. Pain/Pressure: Absent     Pelvic:  Cervical exam deferred        Extremities: Normal range of motion.  Edema: None  Mental Status: Normal mood and affect. Normal behavior. Normal judgment and thought content.   Urinalysis:      Assessment and Plan:  Pregnancy: G5P2102 at 6948w0d  1. Supervision of high risk pregnancy,  antepartum Routine care. BTL papers signed 1/24 - Culture, OB Urine  2. History of cervical incompetence in pregnancy, currently pregnant ppx cerclage in place. Plan for in office removal at 37wks  3. Obesity in pregnancy  4. Sickle cell trait (HCC) Screening today - Culture, OB Urine  5. Rh negative status during pregnancy in third trimester S/p rhogam last visit  6. Elderly multigravida in third trimester No issues.   Preterm labor symptoms and general obstetric precautions including but not limited to vaginal bleeding, contractions, leaking of fluid and fetal movement were reviewed in detail with the patient. Please refer to After Visit Summary for other counseling recommendations.  Return in about 2 weeks (around 08/04/2017) for rob.   [place] BingPickens, Avion Kutzer, MD   btl papers already signed

## 2017-07-23 LAB — URINE CULTURE, OB REFLEX

## 2017-07-23 LAB — CULTURE, OB URINE

## 2017-08-04 ENCOUNTER — Ambulatory Visit (INDEPENDENT_AMBULATORY_CARE_PROVIDER_SITE_OTHER): Payer: Medicaid Other | Admitting: Family Medicine

## 2017-08-04 ENCOUNTER — Encounter (HOSPITAL_COMMUNITY): Payer: Self-pay

## 2017-08-04 ENCOUNTER — Encounter: Payer: Self-pay | Admitting: Family Medicine

## 2017-08-04 VITALS — BP 116/76 | HR 97 | Wt 217.0 lb

## 2017-08-04 DIAGNOSIS — O09523 Supervision of elderly multigravida, third trimester: Secondary | ICD-10-CM

## 2017-08-04 DIAGNOSIS — O09299 Supervision of pregnancy with other poor reproductive or obstetric history, unspecified trimester: Secondary | ICD-10-CM

## 2017-08-04 DIAGNOSIS — O0990 Supervision of high risk pregnancy, unspecified, unspecified trimester: Secondary | ICD-10-CM

## 2017-08-04 NOTE — Progress Notes (Signed)
   PRENATAL VISIT NOTE  Subjective:  Ashley Wilkerson is a 37 y.o. G4P2002 at 6373w0d being seen today for ongoing prenatal care.  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  Patient reports no complaints.  Contractions: Not present. Vag. Bleeding: None.  Movement: Present. Denies leaking of fluid.   The following portions of the patient's history were reviewed and updated as appropriate: allergies, current medications, past family history, past medical history, past social history, past surgical history and problem list. Problem list updated.  Objective:   Vitals:   08/04/17 0952  BP: 116/76  Pulse: 97  Weight: 217 lb (98.4 kg)    Fetal Status: Fetal Heart Rate (bpm): 140 Fundal Height: 34 cm Movement: Present     General:  Alert, oriented and cooperative. Patient is in no acute distress.  Skin: Skin is warm and dry. No rash noted.   Cardiovascular: Normal heart rate noted  Respiratory: Normal respiratory effort, no problems with respiration noted  Abdomen: Soft, gravid, appropriate for gestational age.  Pain/Pressure: Absent     Pelvic: Cervical exam deferred        Extremities: Normal range of motion.  Edema: Trace  Mental Status:  Normal mood and affect. Normal behavior. Normal judgment and thought content.   Assessment and Plan:  Pregnancy: G4P2002 at 5973w0d  1. Supervision of high risk pregnancy, antepartum Continue prenatal care.   2. Elderly multigravida in  third trimester Nml NIPS  3. History of cervical incompetence in pregnancy, currently pregnant With Cerclage in place--no s/sx's of PTL  Preterm labor symptoms and general obstetric precautions including but not limited to vaginal bleeding, contractions, leaking of fluid and fetal movement were reviewed in detail with the patient. Please refer to After Visit Summary for other counseling recommendations.  Return in 2 weeks (on 08/18/2017).   Reva Boresanya S Pratt, MD

## 2017-08-04 NOTE — Patient Instructions (Addendum)
You can take Mucinex, (Allegra, Claritin, Zyrtec-any one of these--they are the same class--same as Benadryl), sudafed, saline nasal spray, steroid nasal sprays like Nasacort or Flonase   Breastfeeding Choosing to breastfeed is one of the best decisions you can make for yourself and your baby. A change in hormones during pregnancy causes your breasts to make breast milk in your milk-producing glands. Hormones prevent breast milk from being released before your baby is born. They also prompt milk flow after birth. Once breastfeeding has begun, thoughts of your baby, as well as his or her sucking or crying, can stimulate the release of milk from your milk-producing glands. Benefits of breastfeeding Research shows that breastfeeding offers many health benefits for infants and mothers. It also offers a cost-free and convenient way to feed your baby. For your baby  Your first milk (colostrum) helps your baby's digestive system to function better.  Special cells in your milk (antibodies) help your baby to fight off infections.  Breastfed babies are less likely to develop asthma, allergies, obesity, or type 2 diabetes. They are also at lower risk for sudden infant death syndrome (SIDS).  Nutrients in breast milk are better able to meet your baby's needs compared to infant formula.  Breast milk improves your baby's brain development. For you  Breastfeeding helps to create a very special bond between you and your baby.  Breastfeeding is convenient. Breast milk costs nothing and is always available at the correct temperature.  Breastfeeding helps to burn calories. It helps you to lose the weight that you gained during pregnancy.  Breastfeeding makes your uterus return faster to its size before pregnancy. It also slows bleeding (lochia) after you give birth.  Breastfeeding helps to lower your risk of developing type 2 diabetes, osteoporosis, rheumatoid arthritis, cardiovascular disease, and breast,  ovarian, uterine, and endometrial cancer later in life. Breastfeeding basics Starting breastfeeding  Find a comfortable place to sit or lie down, with your neck and back well-supported.  Place a pillow or a rolled-up blanket under your baby to bring him or her to the level of your breast (if you are seated). Nursing pillows are specially designed to help support your arms and your baby while you breastfeed.  Make sure that your baby's tummy (abdomen) is facing your abdomen.  Gently massage your breast. With your fingertips, massage from the outer edges of your breast inward toward the nipple. This encourages milk flow. If your milk flows slowly, you may need to continue this action during the feeding.  Support your breast with 4 fingers underneath and your thumb above your nipple (make the letter "C" with your hand). Make sure your fingers are well away from your nipple and your baby's mouth.  Stroke your baby's lips gently with your finger or nipple.  When your baby's mouth is open wide enough, quickly bring your baby to your breast, placing your entire nipple and as much of the areola as possible into your baby's mouth. The areola is the colored area around your nipple. ? More areola should be visible above your baby's upper lip than below the lower lip. ? Your baby's lips should be opened and extended outward (flanged) to ensure an adequate, comfortable latch. ? Your baby's tongue should be between his or her lower gum and your breast.  Make sure that your baby's mouth is correctly positioned around your nipple (latched). Your baby's lips should create a seal on your breast and be turned out (everted).  It is common for  your baby to suck about 2-3 minutes in order to start the flow of breast milk. Latching Teaching your baby how to latch onto your breast properly is very important. An improper latch can cause nipple pain, decreased milk supply, and poor weight gain in your baby. Also, if  your baby is not latched onto your nipple properly, he or she may swallow some air during feeding. This can make your baby fussy. Burping your baby when you switch breasts during the feeding can help to get rid of the air. However, teaching your baby to latch on properly is still the best way to prevent fussiness from swallowing air while breastfeeding. Signs that your baby has successfully latched onto your nipple  Silent tugging or silent sucking, without causing you pain. Infant's lips should be extended outward (flanged).  Swallowing heard between every 3-4 sucks once your milk has started to flow (after your let-down milk reflex occurs).  Muscle movement above and in front of his or her ears while sucking.  Signs that your baby has not successfully latched onto your nipple  Sucking sounds or smacking sounds from your baby while breastfeeding.  Nipple pain.  If you think your baby has not latched on correctly, slip your finger into the corner of your baby's mouth to break the suction and place it between your baby's gums. Attempt to start breastfeeding again. Signs of successful breastfeeding Signs from your baby  Your baby will gradually decrease the number of sucks or will completely stop sucking.  Your baby will fall asleep.  Your baby's body will relax.  Your baby will retain a small amount of milk in his or her mouth.  Your baby will let go of your breast by himself or herself.  Signs from you  Breasts that have increased in firmness, weight, and size 1-3 hours after feeding.  Breasts that are softer immediately after breastfeeding.  Increased milk volume, as well as a change in milk consistency and color by the fifth day of breastfeeding.  Nipples that are not sore, cracked, or bleeding.  Signs that your baby is getting enough milk  Wetting at least 1-2 diapers during the first 24 hours after birth.  Wetting at least 5-6 diapers every 24 hours for the first week  after birth. The urine should be clear or pale yellow by the age of 5 days.  Wetting 6-8 diapers every 24 hours as your baby continues to grow and develop.  At least 3 stools in a 24-hour period by the age of 5 days. The stool should be soft and yellow.  At least 3 stools in a 24-hour period by the age of 7 days. The stool should be seedy and yellow.  No loss of weight greater than 10% of birth weight during the first 3 days of life.  Average weight gain of 4-7 oz (113-198 g) per week after the age of 4 days.  Consistent daily weight gain by the age of 5 days, without weight loss after the age of 2 weeks. After a feeding, your baby may spit up a small amount of milk. This is normal. Breastfeeding frequency and duration Frequent feeding will help you make more milk and can prevent sore nipples and extremely full breasts (breast engorgement). Breastfeed when you feel the need to reduce the fullness of your breasts or when your baby shows signs of hunger. This is called "breastfeeding on demand." Signs that your baby is hungry include:  Increased alertness, activity, or restlessness.  Movement of the head from side to side.  Opening of the mouth when the corner of the mouth or cheek is stroked (rooting).  Increased sucking sounds, smacking lips, cooing, sighing, or squeaking.  Hand-to-mouth movements and sucking on fingers or hands.  Fussing or crying.  Avoid introducing a pacifier to your baby in the first 4-6 weeks after your baby is born. After this time, you may choose to use a pacifier. Research has shown that pacifier use during the first year of a baby's life decreases the risk of sudden infant death syndrome (SIDS). Allow your baby to feed on each breast as long as he or she wants. When your baby unlatches or falls asleep while feeding from the first breast, offer the second breast. Because newborns are often sleepy in the first few weeks of life, you may need to awaken your baby  to get him or her to feed. Breastfeeding times will vary from baby to baby. However, the following rules can serve as a guide to help you make sure that your baby is properly fed:  Newborns (babies 64 weeks of age or younger) may breastfeed every 1-3 hours.  Newborns should not go without breastfeeding for longer than 3 hours during the day or 5 hours during the night.  You should breastfeed your baby a minimum of 8 times in a 24-hour period.  Breast milk pumping Pumping and storing breast milk allows you to make sure that your baby is exclusively fed your breast milk, even at times when you are unable to breastfeed. This is especially important if you go back to work while you are still breastfeeding, or if you are not able to be present during feedings. Your lactation consultant can help you find a method of pumping that works best for you and give you guidelines about how long it is safe to store breast milk. Caring for your breasts while you breastfeed Nipples can become dry, cracked, and sore while breastfeeding. The following recommendations can help keep your breasts moisturized and healthy:  Avoid using soap on your nipples.  Wear a supportive bra designed especially for nursing. Avoid wearing underwire-style bras or extremely tight bras (sports bras).  Air-dry your nipples for 3-4 minutes after each feeding.  Use only cotton bra pads to absorb leaked breast milk. Leaking of breast milk between feedings is normal.  Use lanolin on your nipples after breastfeeding. Lanolin helps to maintain your skin's normal moisture barrier. Pure lanolin is not harmful (not toxic) to your baby. You may also hand express a few drops of breast milk and gently massage that milk into your nipples and allow the milk to air-dry.  In the first few weeks after giving birth, some women experience breast engorgement. Engorgement can make your breasts feel heavy, warm, and tender to the touch. Engorgement peaks  within 3-5 days after you give birth. The following recommendations can help to ease engorgement:  Completely empty your breasts while breastfeeding or pumping. You may want to start by applying warm, moist heat (in the shower or with warm, water-soaked hand towels) just before feeding or pumping. This increases circulation and helps the milk flow. If your baby does not completely empty your breasts while breastfeeding, pump any extra milk after he or she is finished.  Apply ice packs to your breasts immediately after breastfeeding or pumping, unless this is too uncomfortable for you. To do this: ? Put ice in a plastic bag. ? Place a towel between your skin  and the bag. ? Leave the ice on for 20 minutes, 2-3 times a day.  Make sure that your baby is latched on and positioned properly while breastfeeding.  If engorgement persists after 48 hours of following these recommendations, contact your health care provider or a Advertising copywriter. Overall health care recommendations while breastfeeding  Eat 3 healthy meals and 3 snacks every day. Well-nourished mothers who are breastfeeding need an additional 450-500 calories a day. You can meet this requirement by increasing the amount of a balanced diet that you eat.  Drink enough water to keep your urine pale yellow or clear.  Rest often, relax, and continue to take your prenatal vitamins to prevent fatigue, stress, and low vitamin and mineral levels in your body (nutrient deficiencies).  Do not use any products that contain nicotine or tobacco, such as cigarettes and e-cigarettes. Your baby may be harmed by chemicals from cigarettes that pass into breast milk and exposure to secondhand smoke. If you need help quitting, ask your health care provider.  Avoid alcohol.  Do not use illegal drugs or marijuana.  Talk with your health care provider before taking any medicines. These include over-the-counter and prescription medicines as well as vitamins  and herbal supplements. Some medicines that may be harmful to your baby can pass through breast milk.  It is possible to become pregnant while breastfeeding. If birth control is desired, ask your health care provider about options that will be safe while breastfeeding your baby. Where to find more information: [employer]: www.llli.org Contact a health care provider if:  You feel like you want to stop breastfeeding or have become frustrated with breastfeeding.  Your nipples are cracked or bleeding.  Your breasts are red, tender, or warm.  You have: ? Painful breasts or nipples. ? A swollen area on either breast. ? A fever or chills. ? Nausea or vomiting. ? Drainage other than breast milk from your nipples.  Your breasts do not become full before feedings by the fifth day after you give birth.  You feel sad and depressed.  Your baby is: ? Too sleepy to eat well. ? Having trouble sleeping. ? More than 75 week old and wetting fewer than 6 diapers in a 24-hour period. ? Not gaining weight by 68 days of age.  Your baby has fewer than 3 stools in a 24-hour period.  Your baby's skin or the white parts of his or her eyes become yellow. Get help right away if:  Your baby is overly tired (lethargic) and does not want to wake up and feed.  Your baby develops an unexplained fever. Summary  Breastfeeding offers many health benefits for infant and mothers.  Try to breastfeed your infant when he or she shows early signs of hunger.  Gently tickle or stroke your baby's lips with your finger or nipple to allow the baby to open his or her mouth. Bring the baby to your breast. Make sure that much of the areola is in your baby's mouth. Offer one side and burp the baby before you offer the other side.  Talk with your health care provider or lactation consultant if you have questions or you face problems as you breastfeed. This information is not intended to replace advice  given to you by your health care provider. Make sure you discuss any questions you have with your health care provider. Document Released: 05/19/2005 Document Revised: 06/20/2016 Document Reviewed: 06/20/2016 Elsevier Interactive Patient Education  [employer].

## 2017-08-05 ENCOUNTER — Encounter (HOSPITAL_COMMUNITY): Payer: Self-pay

## 2017-08-05 ENCOUNTER — Ambulatory Visit (HOSPITAL_COMMUNITY)
Admission: RE | Admit: 2017-08-05 | Discharge: 2017-08-05 | Disposition: A | Discharge status: Home / Self Care | Payer: Medicaid Other | Source: Ambulatory Visit | Attending: Obstetrics & Gynecology | Admitting: Obstetrics & Gynecology

## 2017-08-05 ENCOUNTER — Other Ambulatory Visit (HOSPITAL_COMMUNITY): Payer: Self-pay | Admitting: Obstetrics and Gynecology

## 2017-08-05 DIAGNOSIS — Z862 Personal history of diseases of the blood and blood-forming organs and certain disorders involving the immune mechanism: Secondary | ICD-10-CM

## 2017-08-05 DIAGNOSIS — O99213 Obesity complicating pregnancy, third trimester: Secondary | ICD-10-CM

## 2017-08-05 DIAGNOSIS — O09523 Supervision of elderly multigravida, third trimester: Secondary | ICD-10-CM

## 2017-08-05 DIAGNOSIS — D573 Sickle-cell trait: Secondary | ICD-10-CM | POA: Diagnosis not present

## 2017-08-05 DIAGNOSIS — O99013 Anemia complicating pregnancy, third trimester: Secondary | ICD-10-CM | POA: Diagnosis not present

## 2017-08-05 DIAGNOSIS — E669 Obesity, unspecified: Secondary | ICD-10-CM | POA: Insufficient documentation

## 2017-08-05 DIAGNOSIS — Z3A33 33 weeks gestation of pregnancy: Secondary | ICD-10-CM | POA: Diagnosis not present

## 2017-08-05 DIAGNOSIS — O09893 Supervision of other high risk pregnancies, third trimester: Secondary | ICD-10-CM | POA: Insufficient documentation

## 2017-08-05 DIAGNOSIS — O3433 Maternal care for cervical incompetence, third trimester: Secondary | ICD-10-CM

## 2017-08-05 DIAGNOSIS — Z362 Encounter for other antenatal screening follow-up: Secondary | ICD-10-CM | POA: Diagnosis not present

## 2017-08-18 ENCOUNTER — Ambulatory Visit (INDEPENDENT_AMBULATORY_CARE_PROVIDER_SITE_OTHER): Payer: Medicaid Other | Admitting: Family Medicine

## 2017-08-18 VITALS — BP 113/76 | HR 109 | Wt 218.0 lb

## 2017-08-18 DIAGNOSIS — O0993 Supervision of high risk pregnancy, unspecified, third trimester: Secondary | ICD-10-CM

## 2017-08-18 DIAGNOSIS — O0990 Supervision of high risk pregnancy, unspecified, unspecified trimester: Secondary | ICD-10-CM

## 2017-08-18 DIAGNOSIS — O09299 Supervision of pregnancy with other poor reproductive or obstetric history, unspecified trimester: Secondary | ICD-10-CM

## 2017-08-18 DIAGNOSIS — O09293 Supervision of pregnancy with other poor reproductive or obstetric history, third trimester: Secondary | ICD-10-CM

## 2017-08-18 NOTE — Progress Notes (Signed)
   PRENATAL VISIT NOTE  Subjective:  Ashley Wilkerson is a 37 y.o. G4P2002 at 1834w0d being seen today for ongoing prenatal care.  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  Patient reports no complaints.  Contractions: Not present. Vag. Bleeding: None.  Movement: Present. Denies leaking of fluid.   The following portions of the patient's history were reviewed and updated as appropriate: allergies, current medications, past family history, past medical history, past social history, past surgical history and problem list. Problem list updated.  Objective:   Vitals:   08/18/17 0942  BP: 113/76  Pulse: (!) 109  Weight: 218 lb (98.9 kg)    Fetal Status: Fetal Heart Rate (bpm): 140 Fundal Height: 34 cm Movement: Present     General:  Alert, oriented and cooperative. Patient is in no acute distress.  Skin: Skin is warm and dry. No rash noted.   Cardiovascular: Normal heart rate noted  Respiratory: Normal respiratory effort, no problems with respiration noted  Abdomen: Soft, gravid, appropriate for gestational age.  Pain/Pressure: Absent     Pelvic: Cervical exam deferred        Extremities: Normal range of motion.  Edema: Trace  Mental Status:  Normal mood and affect. Normal behavior. Normal judgment and thought content.   Assessment and Plan:  Pregnancy: G4P2002 at 734w0d  1. Supervision of high risk pregnancy, antepartum Continue routine prenatal care. Cultures next  week.   2. History of cervical incompetence in pregnancy, currently pregnant S/p cerclage--continue--plan removal at 37 wks  Preterm labor symptoms and general obstetric precautions including but not limited to vaginal bleeding, contractions, leaking of fluid and fetal movement were reviewed in detail with the patient. Please refer to After Visit Summary for other counseling recommendations.  Return in 1 week (on 08/25/2017).   Reva Boresanya S Sabino Denning, MD

## 2017-08-18 NOTE — Patient Instructions (Signed)
Third Trimester of Pregnancy The third trimester is from week 28 through week 40 (months 7 through 9). The third trimester is a time when the unborn baby (fetus) is growing rapidly. At the end of the ninth month, the fetus is about 20 inches in length and weighs 6-10 pounds. Body changes during your third trimester Your body will continue to go through many changes during pregnancy. The changes vary from woman to woman. During the third trimester:  Your weight will continue to increase. You can expect to gain 25-35 pounds (11-16 kg) by the end of the pregnancy.  You may begin to get stretch marks on your hips, abdomen, and breasts.  You may urinate more often because the fetus is moving lower into your pelvis and pressing on your bladder.  You may develop or continue to have heartburn. This is caused by increased hormones that slow down muscles in the digestive tract.  You may develop or continue to have constipation because increased hormones slow digestion and cause the muscles that push waste through your intestines to relax.  You may develop hemorrhoids. These are swollen veins (varicose veins) in the rectum that can itch or be painful.  You may develop swollen, bulging veins (varicose veins) in your legs.  You may have increased body aches in the pelvis, back, or thighs. This is due to weight gain and increased hormones that are relaxing your joints.  You may have changes in your hair. These can include thickening of your hair, rapid growth, and changes in texture. Some women also have hair loss during or after pregnancy, or hair that feels dry or thin. Your hair will most likely return to normal after your baby is born.  Your breasts will continue to grow and they will continue to become tender. A yellow fluid (colostrum) may leak from your breasts. This is the first milk you are producing for your baby.  Your belly button may stick out.  You may notice more swelling in your hands,  face, or ankles.  You may have increased tingling or numbness in your hands, arms, and legs. The skin on your belly may also feel numb.  You may feel short of breath because of your expanding uterus.  You may have more problems sleeping. This can be caused by the size of your belly, increased need to urinate, and an increase in your body's metabolism.  You may notice the fetus "dropping," or moving lower in your abdomen (lightening).  You may have increased vaginal discharge.  You may notice your joints feel loose and you may have pain around your pelvic bone.  What to expect at prenatal visits You will have prenatal exams every 2 weeks until week 36. Then you will have weekly prenatal exams. During a routine prenatal visit:  You will be weighed to make sure you and the baby are growing normally.  Your blood pressure will be taken.  Your abdomen will be measured to track your baby's growth.  The fetal heartbeat will be listened to.  Any test results from the previous visit will be discussed.  You may have a cervical check near your due date to see if your cervix has softened or thinned (effaced).  You will be tested for Group B streptococcus. This happens between 35 and 37 weeks.  Your health care provider may ask you:  What your birth plan is.  How you are feeling.  If you are feeling the baby move.  If you have had   any abnormal symptoms, such as leaking fluid, bleeding, severe headaches, or abdominal cramping.  If you are using any tobacco products, including cigarettes, chewing tobacco, and electronic cigarettes.  If you have any questions.  Other tests or screenings that may be performed during your third trimester include:  Blood tests that check for low iron levels (anemia).  Fetal testing to check the health, activity level, and growth of the fetus. Testing is done if you have certain medical conditions or if there are problems during the  pregnancy.  Nonstress test (NST). This test checks the health of your baby to make sure there are no signs of problems, such as the baby not getting enough oxygen. During this test, a belt is placed around your belly. The baby is made to move, and its heart rate is monitored during movement.  What is false labor? False labor is a condition in which you feel small, irregular tightenings of the muscles in the womb (contractions) that usually go away with rest, changing position, or drinking water. These are called Braxton Hicks contractions. Contractions may last for hours, days, or even weeks before true labor sets in. If contractions come at regular intervals, become more frequent, increase in intensity, or become painful, you should see your health care provider. What are the signs of labor?  Abdominal cramps.  Regular contractions that start at 10 minutes apart and become stronger and more frequent with time.  Contractions that start on the top of the uterus and spread down to the lower abdomen and back.  Increased pelvic pressure and dull back pain.  A watery or bloody mucus discharge that comes from the vagina.  Leaking of amniotic fluid. This is also known as your "water breaking." It could be a slow trickle or a gush. Let your health care provider know if it has a color or strange odor. If you have any of these signs, call your health care provider right away, even if it is before your due date. Follow these instructions at home: Medicines  Follow your health care provider's instructions regarding medicine use. Specific medicines may be either safe or unsafe to take during pregnancy.  Take a prenatal vitamin that contains at least 600 micrograms (mcg) of folic acid.  If you develop constipation, try taking a stool softener if your health care provider approves. Eating and drinking  Eat a balanced diet that includes fresh fruits and vegetables, whole grains, good sources of protein  such as meat, eggs, or tofu, and low-fat dairy. Your health care provider will help you determine the amount of weight gain that is right for you.  Avoid raw meat and uncooked cheese. These carry germs that can cause birth defects in the baby.  If you have low calcium intake from food, talk to your health care provider about whether you should take a daily calcium supplement.  Eat four or five small meals rather than three large meals a day.  Limit foods that are high in fat and processed sugars, such as fried and sweet foods.  To prevent constipation: ? Drink enough fluid to keep your urine clear or pale yellow. ? Eat foods that are high in fiber, such as fresh fruits and vegetables, whole grains, and beans. Activity  Exercise only as directed by your health care provider. Most women can continue their usual exercise routine during pregnancy. Try to exercise for 30 minutes at least 5 days a week. Stop exercising if you experience uterine contractions.  Avoid heavy   lifting.  Do not exercise in extreme heat or humidity, or at high altitudes.  Wear low-heel, comfortable shoes.  Practice good posture.  You may continue to have sex unless your health care provider tells you otherwise. Relieving pain and discomfort  Take frequent breaks and rest with your legs elevated if you have leg cramps or low back pain.  Take warm sitz baths to soothe any pain or discomfort caused by hemorrhoids. Use hemorrhoid cream if your health care provider approves.  Wear a good support bra to prevent discomfort from breast tenderness.  If you develop varicose veins: ? Wear support pantyhose or compression stockings as told by your healthcare provider. ? Elevate your feet for 15 minutes, 3-4 times a day. Prenatal care  Write down your questions. Take them to your prenatal visits.  Keep all your prenatal visits as told by your health care provider. This is important. Safety  Wear your seat belt at  all times when driving.  Make a list of emergency phone numbers, including numbers for family, friends, the hospital, and police and fire departments. General instructions  Avoid cat litter boxes and soil used by cats. These carry germs that can cause birth defects in the baby. If you have a cat, ask someone to clean the litter box for you.  Do not travel far distances unless it is absolutely necessary and only with the approval of your health care provider.  Do not use hot tubs, steam rooms, or saunas.  Do not drink alcohol.  Do not use any products that contain nicotine or tobacco, such as cigarettes and e-cigarettes. If you need help quitting, ask your health care provider.  Do not use any medicinal herbs or unprescribed drugs. These chemicals affect the formation and growth of the baby.  Do not douche or use tampons or scented sanitary pads.  Do not cross your legs for long periods of time.  To prepare for the arrival of your baby: ? Take prenatal classes to understand, practice, and ask questions about labor and delivery. ? Make a trial run to the hospital. ? Visit the hospital and tour the maternity area. ? Arrange for maternity or paternity leave through employers. ? Arrange for family and friends to take care of pets while you are in the hospital. ? Purchase a rear-facing car seat and make sure you know how to install it in your car. ? Pack your hospital bag. ? Prepare the baby's nursery. Make sure to remove all pillows and stuffed animals from the baby's crib to prevent suffocation.  Visit your dentist if you have not gone during your pregnancy. Use a soft toothbrush to brush your teeth and be gentle when you floss. Contact a health care provider if:  You are unsure if you are in labor or if your water has broken.  You become dizzy.  You have mild pelvic cramps, pelvic pressure, or nagging pain in your abdominal area.  You have lower back pain.  You have persistent  nausea, vomiting, or diarrhea.  You have an unusual or bad smelling vaginal discharge.  You have pain when you urinate. Get help right away if:  Your water breaks before 37 weeks.  You have regular contractions less than 5 minutes apart before 37 weeks.  You have a fever.  You are leaking fluid from your vagina.  You have spotting or bleeding from your vagina.  You have severe abdominal pain or cramping.  You have rapid weight loss or weight gain.    You have shortness of breath with chest pain.  You notice sudden or extreme swelling of your face, hands, ankles, feet, or legs.  Your baby makes fewer than 10 movements in 2 hours.  You have severe headaches that do not go away when you take medicine.  You have vision changes. Summary  The third trimester is from week 28 through week 40, months 7 through 9. The third trimester is a time when the unborn baby (fetus) is growing rapidly.  During the third trimester, your discomfort may increase as you and your baby continue to gain weight. You may have abdominal, leg, and back pain, sleeping problems, and an increased need to urinate.  During the third trimester your breasts will keep growing and they will continue to become tender. A yellow fluid (colostrum) may leak from your breasts. This is the first milk you are producing for your baby.  False labor is a condition in which you feel small, irregular tightenings of the muscles in the womb (contractions) that eventually go away. These are called Braxton Hicks contractions. Contractions may last for hours, days, or even weeks before true labor sets in.  Signs of labor can include: abdominal cramps; regular contractions that start at 10 minutes apart and become stronger and more frequent with time; watery or bloody mucus discharge that comes from the vagina; increased pelvic pressure and dull back pain; and leaking of amniotic fluid. This information is not intended to replace advice  given to you by your health care provider. Make sure you discuss any questions you have with your health care provider. Document Released: 05/13/2001 Document Revised: 10/25/2015 Document Reviewed: 07/20/2012 [employer] Interactive Patient Education  2017 [employer]  Breastfeeding Choosing to breastfeed is one of the best decisions you can make for yourself and your baby. A change in hormones during pregnancy causes your breasts to make breast milk in your milk-producing glands. Hormones prevent breast milk from being released before your baby is born. They also prompt milk flow after birth. Once breastfeeding has begun, thoughts of your baby, as well as his or her sucking or crying, can stimulate the release of milk from your milk-producing glands. Benefits of breastfeeding Research shows that breastfeeding offers many health benefits for infants and mothers. It also offers a cost-free and convenient way to feed your baby. For your baby  Your first milk (colostrum) helps your baby's digestive system to function better.  Special cells in your milk (antibodies) help your baby to fight off infections.  Breastfed babies are less likely to develop asthma, allergies, obesity, or type 2 diabetes. They are also at lower risk for sudden infant death syndrome (SIDS).  Nutrients in breast milk are better able to meet your baby's needs compared to infant formula.  Breast milk improves your baby's brain development. For you  Breastfeeding helps to create a very special bond between you and your baby.  Breastfeeding is convenient. Breast milk costs nothing and is always available at the correct temperature.  Breastfeeding helps to burn calories. It helps you to lose the weight that you gained during pregnancy.  Breastfeeding makes your uterus return faster to its size before pregnancy. It also slows bleeding (lochia) after you give birth.  Breastfeeding helps to lower your risk of developing type 2  diabetes, osteoporosis, rheumatoid arthritis, cardiovascular disease, and breast, ovarian, uterine, and endometrial cancer later in life. Breastfeeding basics Starting breastfeeding  Find a comfortable place to sit or lie down, with your neck and back   well-supported.  Place a pillow or a rolled-up blanket under your baby to bring him or her to the level of your breast (if you are seated). Nursing pillows are specially designed to help support your arms and your baby while you breastfeed.  Make sure that your baby's tummy (abdomen) is facing your abdomen.  Gently massage your breast. With your fingertips, massage from the outer edges of your breast inward toward the nipple. This encourages milk flow. If your milk flows slowly, you may need to continue this action during the feeding.  Support your breast with 4 fingers underneath and your thumb above your nipple (make the letter "C" with your hand). Make sure your fingers are well away from your nipple and your baby's mouth.  Stroke your baby's lips gently with your finger or nipple.  When your baby's mouth is open wide enough, quickly bring your baby to your breast, placing your entire nipple and as much of the areola as possible into your baby's mouth. The areola is the colored area around your nipple. ? More areola should be visible above your baby's upper lip than below the lower lip. ? Your baby's lips should be opened and extended outward (flanged) to ensure an adequate, comfortable latch. ? Your baby's tongue should be between his or her lower gum and your breast.  Make sure that your baby's mouth is correctly positioned around your nipple (latched). Your baby's lips should create a seal on your breast and be turned out (everted).  It is common for your baby to suck about 2-3 minutes in order to start the flow of breast milk. Latching Teaching your baby how to latch onto your breast properly is very important. An improper latch can  cause nipple pain, decreased milk supply, and poor weight gain in your baby. Also, if your baby is not latched onto your nipple properly, he or she may swallow some air during feeding. This can make your baby fussy. Burping your baby when you switch breasts during the feeding can help to get rid of the air. However, teaching your baby to latch on properly is still the best way to prevent fussiness from swallowing air while breastfeeding. Signs that your baby has successfully latched onto your nipple  Silent tugging or silent sucking, without causing you pain. Infant's lips should be extended outward (flanged).  Swallowing heard between every 3-4 sucks once your milk has started to flow (after your let-down milk reflex occurs).  Muscle movement above and in front of his or her ears while sucking.  Signs that your baby has not successfully latched onto your nipple  Sucking sounds or smacking sounds from your baby while breastfeeding.  Nipple pain.  If you think your baby has not latched on correctly, slip your finger into the corner of your baby's mouth to break the suction and place it between your baby's gums. Attempt to start breastfeeding again. Signs of successful breastfeeding Signs from your baby  Your baby will gradually decrease the number of sucks or will completely stop sucking.  Your baby will fall asleep.  Your baby's body will relax.  Your baby will retain a small amount of milk in his or her mouth.  Your baby will let go of your breast by himself or herself.  Signs from you  Breasts that have increased in firmness, weight, and size 1-3 hours after feeding.  Breasts that are softer immediately after breastfeeding.  Increased milk volume, as well as a change in milk   consistency and color by the fifth day of breastfeeding.  Nipples that are not sore, cracked, or bleeding.  Signs that your baby is getting enough milk  Wetting at least 1-2 diapers during the first 24  hours after birth.  Wetting at least 5-6 diapers every 24 hours for the first week after birth. The urine should be clear or pale yellow by the age of 5 days.  Wetting 6-8 diapers every 24 hours as your baby continues to grow and develop.  At least 3 stools in a 24-hour period by the age of 5 days. The stool should be soft and yellow.  At least 3 stools in a 24-hour period by the age of 7 days. The stool should be seedy and yellow.  No loss of weight greater than 10% of birth weight during the first 3 days of life.  Average weight gain of 4-7 oz (113-198 g) per week after the age of 4 days.  Consistent daily weight gain by the age of 5 days, without weight loss after the age of 2 weeks. After a feeding, your baby may spit up a small amount of milk. This is normal. Breastfeeding frequency and duration Frequent feeding will help you make more milk and can prevent sore nipples and extremely full breasts (breast engorgement). Breastfeed when you feel the need to reduce the fullness of your breasts or when your baby shows signs of hunger. This is called "breastfeeding on demand." Signs that your baby is hungry include:  Increased alertness, activity, or restlessness.  Movement of the head from side to side.  Opening of the mouth when the corner of the mouth or cheek is stroked (rooting).  Increased sucking sounds, smacking lips, cooing, sighing, or squeaking.  Hand-to-mouth movements and sucking on fingers or hands.  Fussing or crying.  Avoid introducing a pacifier to your baby in the first 4-6 weeks after your baby is born. After this time, you may choose to use a pacifier. Research has shown that pacifier use during the first year of a baby's life decreases the risk of sudden infant death syndrome (SIDS). Allow your baby to feed on each breast as long as he or she wants. When your baby unlatches or falls asleep while feeding from the first breast, offer the second breast. Because  newborns are often sleepy in the first few weeks of life, you may need to awaken your baby to get him or her to feed. Breastfeeding times will vary from baby to baby. However, the following rules can serve as a guide to help you make sure that your baby is properly fed:  Newborns (babies 4 weeks of age or younger) may breastfeed every 1-3 hours.  Newborns should not go without breastfeeding for longer than 3 hours during the day or 5 hours during the night.  You should breastfeed your baby a minimum of 8 times in a 24-hour period.  Breast milk pumping Pumping and storing breast milk allows you to make sure that your baby is exclusively fed your breast milk, even at times when you are unable to breastfeed. This is especially important if you go back to work while you are still breastfeeding, or if you are not able to be present during feedings. Your lactation consultant can help you find a method of pumping that works best for you and give you guidelines about how long it is safe to store breast milk. Caring for your breasts while you breastfeed Nipples can become dry, cracked, and   sore while breastfeeding. The following recommendations can help keep your breasts moisturized and healthy:  Avoid using soap on your nipples.  Wear a supportive bra designed especially for nursing. Avoid wearing underwire-style bras or extremely tight bras (sports bras).  Air-dry your nipples for 3-4 minutes after each feeding.  Use only cotton bra pads to absorb leaked breast milk. Leaking of breast milk between feedings is normal.  Use lanolin on your nipples after breastfeeding. Lanolin helps to maintain your skin's normal moisture barrier. Pure lanolin is not harmful (not toxic) to your baby. You may also hand express a few drops of breast milk and gently massage that milk into your nipples and allow the milk to air-dry.  In the first few weeks after giving birth, some women experience breast engorgement.  Engorgement can make your breasts feel heavy, warm, and tender to the touch. Engorgement peaks within 3-5 days after you give birth. The following recommendations can help to ease engorgement:  Completely empty your breasts while breastfeeding or pumping. You may want to start by applying warm, moist heat (in the shower or with warm, water-soaked hand towels) just before feeding or pumping. This increases circulation and helps the milk flow. If your baby does not completely empty your breasts while breastfeeding, pump any extra milk after he or she is finished.  Apply ice packs to your breasts immediately after breastfeeding or pumping, unless this is too uncomfortable for you. To do this: ? Put ice in a plastic bag. ? Place a towel between your skin and the bag. ? Leave the ice on for 20 minutes, 2-3 times a day.  Make sure that your baby is latched on and positioned properly while breastfeeding.  If engorgement persists after 48 hours of following these recommendations, contact your health care provider or a lactation consultant. Overall health care recommendations while breastfeeding  Eat 3 healthy meals and 3 snacks every day. Well-nourished mothers who are breastfeeding need an additional 450-500 calories a day. You can meet this requirement by increasing the amount of a balanced diet that you eat.  Drink enough water to keep your urine pale yellow or clear.  Rest often, relax, and continue to take your prenatal vitamins to prevent fatigue, stress, and low vitamin and mineral levels in your body (nutrient deficiencies).  Do not use any products that contain nicotine or tobacco, such as cigarettes and e-cigarettes. Your baby may be harmed by chemicals from cigarettes that pass into breast milk and exposure to secondhand smoke. If you need help quitting, ask your health care provider.  Avoid alcohol.  Do not use illegal drugs or marijuana.  Talk with your health care provider before  taking any medicines. These include over-the-counter and prescription medicines as well as vitamins and herbal supplements. Some medicines that may be harmful to your baby can pass through breast milk.  It is possible to become pregnant while breastfeeding. If birth control is desired, ask your health care provider about options that will be safe while breastfeeding your baby. Where to find more information: [employer]: www.llli.org Contact a health care provider if:  You feel like you want to stop breastfeeding or have become frustrated with breastfeeding.  Your nipples are cracked or bleeding.  Your breasts are red, tender, or warm.  You have: ? Painful breasts or nipples. ? A swollen area on either breast. ? A fever or chills. ? Nausea or vomiting. ? Drainage other than breast milk from your nipples.  Your   breasts do not become full before feedings by the fifth day after you give birth.  You feel sad and depressed.  Your baby is: ? Too sleepy to eat well. ? Having trouble sleeping. ? More than 1 week old and wetting fewer than 6 diapers in a 24-hour period. ? Not gaining weight by 5 days of age.  Your baby has fewer than 3 stools in a 24-hour period.  Your baby's skin or the white parts of his or her eyes become yellow. Get help right away if:  Your baby is overly tired (lethargic) and does not want to wake up and feed.  Your baby develops an unexplained fever. Summary  Breastfeeding offers many health benefits for infant and mothers.  Try to breastfeed your infant when he or she shows early signs of hunger.  Gently tickle or stroke your baby's lips with your finger or nipple to allow the baby to open his or her mouth. Bring the baby to your breast. Make sure that much of the areola is in your baby's mouth. Offer one side and burp the baby before you offer the other side.  Talk with your health care provider or lactation consultant if you have  questions or you face problems as you breastfeed. This information is not intended to replace advice given to you by your health care provider. Make sure you discuss any questions you have with your health care provider. Document Released: 05/19/2005 Document Revised: 06/20/2016 Document Reviewed: 06/20/2016 Elsevier Interactive Patient Education  2018 Elsevier Inc.  

## 2017-08-24 ENCOUNTER — Inpatient Hospital Stay (HOSPITAL_COMMUNITY): Payer: Medicaid Other | Admitting: Anesthesiology

## 2017-08-24 ENCOUNTER — Telehealth: Payer: Self-pay | Admitting: Radiology

## 2017-08-24 ENCOUNTER — Encounter (HOSPITAL_COMMUNITY)
Admission: AD | Disposition: A | Discharge status: Home / Self Care | Payer: Self-pay | Source: Ambulatory Visit | Attending: Obstetrics and Gynecology

## 2017-08-24 ENCOUNTER — Inpatient Hospital Stay (HOSPITAL_COMMUNITY)
Admission: AD | Admit: 2017-08-24 | Discharge: 2017-08-26 | DRG: 768 | Disposition: A | Discharge status: Home / Self Care | Payer: Medicaid Other | Source: Ambulatory Visit | Attending: Obstetrics and Gynecology | Admitting: Obstetrics and Gynecology

## 2017-08-24 ENCOUNTER — Encounter (HOSPITAL_COMMUNITY): Payer: Self-pay | Admitting: *Deleted

## 2017-08-24 ENCOUNTER — Encounter (HOSPITAL_COMMUNITY): Payer: Self-pay

## 2017-08-24 DIAGNOSIS — Z6791 Unspecified blood type, Rh negative: Secondary | ICD-10-CM | POA: Diagnosis not present

## 2017-08-24 DIAGNOSIS — O43123 Velamentous insertion of umbilical cord, third trimester: Secondary | ICD-10-CM | POA: Diagnosis present

## 2017-08-24 DIAGNOSIS — O26893 Other specified pregnancy related conditions, third trimester: Secondary | ICD-10-CM | POA: Diagnosis not present

## 2017-08-24 DIAGNOSIS — O09529 Supervision of elderly multigravida, unspecified trimester: Secondary | ICD-10-CM

## 2017-08-24 DIAGNOSIS — Z3A35 35 weeks gestation of pregnancy: Secondary | ICD-10-CM

## 2017-08-24 DIAGNOSIS — O99214 Obesity complicating childbirth: Secondary | ICD-10-CM | POA: Diagnosis not present

## 2017-08-24 DIAGNOSIS — Z302 Encounter for sterilization: Secondary | ICD-10-CM

## 2017-08-24 DIAGNOSIS — O09299 Supervision of pregnancy with other poor reproductive or obstetric history, unspecified trimester: Secondary | ICD-10-CM

## 2017-08-24 DIAGNOSIS — O3433 Maternal care for cervical incompetence, third trimester: Secondary | ICD-10-CM | POA: Diagnosis not present

## 2017-08-24 DIAGNOSIS — D573 Sickle-cell trait: Secondary | ICD-10-CM | POA: Diagnosis not present

## 2017-08-24 DIAGNOSIS — O26899 Other specified pregnancy related conditions, unspecified trimester: Secondary | ICD-10-CM

## 2017-08-24 DIAGNOSIS — O0990 Supervision of high risk pregnancy, unspecified, unspecified trimester: Secondary | ICD-10-CM

## 2017-08-24 DIAGNOSIS — O9902 Anemia complicating childbirth: Secondary | ICD-10-CM | POA: Diagnosis present

## 2017-08-24 DIAGNOSIS — Z7982 Long term (current) use of aspirin: Secondary | ICD-10-CM

## 2017-08-24 DIAGNOSIS — O99210 Obesity complicating pregnancy, unspecified trimester: Secondary | ICD-10-CM | POA: Diagnosis present

## 2017-08-24 HISTORY — PX: TUBAL LIGATION: SHX77

## 2017-08-24 LAB — CBC
HCT: 38.7 % (ref 36.0–46.0)
Hemoglobin: 13.4 g/dL (ref 12.0–15.0)
MCH: 29.1 pg (ref 26.0–34.0)
MCHC: 34.6 g/dL (ref 30.0–36.0)
MCV: 84.1 fL (ref 78.0–100.0)
Platelets: 206 10*3/uL (ref 150–400)
RBC: 4.6 MIL/uL (ref 3.87–5.11)
RDW: 14.6 % (ref 11.5–15.5)
WBC: 10.4 10*3/uL (ref 4.0–10.5)

## 2017-08-24 LAB — PROTEIN / CREATININE RATIO, URINE
Creatinine, Urine: 202 mg/dL
Protein Creatinine Ratio: 0.12 mg/mg{Cre} (ref 0.00–0.15)
Total Protein, Urine: 25 mg/dL

## 2017-08-24 LAB — OB RESULTS CONSOLE GBS: STREP GROUP B AG: NEGATIVE

## 2017-08-24 LAB — COMPREHENSIVE METABOLIC PANEL
ALBUMIN: 3 g/dL — AB (ref 3.5–5.0)
ALT: 23 U/L (ref 14–54)
ANION GAP: 12 (ref 5–15)
AST: 34 U/L (ref 15–41)
Alkaline Phosphatase: 184 U/L — ABNORMAL HIGH (ref 38–126)
BUN: 5 mg/dL — ABNORMAL LOW (ref 6–20)
CHLORIDE: 104 mmol/L (ref 101–111)
CO2: 19 mmol/L — AB (ref 22–32)
Calcium: 8.8 mg/dL — ABNORMAL LOW (ref 8.9–10.3)
Creatinine, Ser: 0.56 mg/dL (ref 0.44–1.00)
GFR calc Af Amer: >60 mL/min (ref >60)
GFR calc non Af Amer: >60 mL/min (ref >60)
GLUCOSE: 100 mg/dL — AB (ref 65–99)
POTASSIUM: 3.4 mmol/L — AB (ref 3.5–5.1)
SODIUM: 135 mmol/L (ref 135–145)
TOTAL PROTEIN: 6.2 g/dL — AB (ref 6.5–8.1)
Total Bilirubin: 0.5 mg/dL (ref 0.3–1.2)

## 2017-08-24 LAB — URINALYSIS, ROUTINE W REFLEX MICROSCOPIC
Bilirubin Urine: NEGATIVE
Glucose, UA: NEGATIVE mg/dL
Hgb urine dipstick: NEGATIVE
Ketones, ur: 80 mg/dL — AB
Leukocytes, UA: NEGATIVE
Nitrite: NEGATIVE
Protein, ur: NEGATIVE mg/dL
Specific Gravity, Urine: 1.017 (ref 1.005–1.030)
pH: 5 (ref 5.0–8.0)

## 2017-08-24 LAB — GROUP B STREP BY PCR: Group B strep by PCR: NEGATIVE

## 2017-08-24 LAB — RPR: RPR Ser Ql: NONREACTIVE

## 2017-08-24 SURGERY — LIGATION, FALLOPIAN TUBE, POSTPARTUM
Anesthesia: Epidural | Laterality: Bilateral

## 2017-08-24 MED ORDER — PHENYLEPHRINE 40 MCG/ML (10ML) SYRINGE FOR IV PUSH (FOR BLOOD PRESSURE SUPPORT)
80.0000 ug | PREFILLED_SYRINGE | INTRAVENOUS | Status: DC | PRN
  Filled 2017-08-24: qty 10

## 2017-08-24 MED ORDER — ONDANSETRON HCL 4 MG/2ML IJ SOLN
INTRAMUSCULAR | Status: AC
  Filled 2017-08-24: qty 2

## 2017-08-24 MED ORDER — MIDAZOLAM HCL 2 MG/2ML IJ SOLN
INTRAMUSCULAR | Status: AC
  Filled 2017-08-24: qty 2

## 2017-08-24 MED ORDER — BETAMETHASONE SOD PHOS & ACET 6 (3-3) MG/ML IJ SUSP
12.0000 mg | INTRAMUSCULAR | Status: DC
  Administered 2017-08-24: 12 mg via INTRAMUSCULAR
  Filled 2017-08-24: qty 2

## 2017-08-24 MED ORDER — BUPIVACAINE HCL (PF) 0.25 % IJ SOLN
INTRAMUSCULAR | Status: DC | PRN
  Administered 2017-08-24: 10 mL

## 2017-08-24 MED ORDER — SIMETHICONE 80 MG PO CHEW
80.0000 mg | CHEWABLE_TABLET | ORAL | Status: DC | PRN
Start: 2017-08-24 — End: 2017-08-26

## 2017-08-24 MED ORDER — KETOROLAC TROMETHAMINE 30 MG/ML IJ SOLN
INTRAMUSCULAR | Status: DC | PRN
  Administered 2017-08-24: 30 mg via INTRAVENOUS

## 2017-08-24 MED ORDER — PENICILLIN G POT IN DEXTROSE 60000 UNIT/ML IV SOLN
3.0000 10*6.[IU] | INTRAVENOUS | Status: DC
  Filled 2017-08-24: qty 50

## 2017-08-24 MED ORDER — SOD CITRATE-CITRIC ACID 500-334 MG/5ML PO SOLN
30.0000 mL | Freq: Once | ORAL | Status: AC
  Administered 2017-08-24: 30 mL via ORAL

## 2017-08-24 MED ORDER — ONDANSETRON HCL 4 MG/2ML IJ SOLN
INTRAMUSCULAR | Status: DC | PRN
  Administered 2017-08-24: 4 mg via INTRAVENOUS

## 2017-08-24 MED ORDER — SODIUM CHLORIDE 0.9 % IV SOLN
2.0000 g | Freq: Once | INTRAVENOUS | Status: AC
  Administered 2017-08-24: 2 g via INTRAVENOUS
  Filled 2017-08-24: qty 2

## 2017-08-24 MED ORDER — EPHEDRINE 5 MG/ML INJ: 10.0000 mg | INTRAVENOUS | Status: DC | PRN

## 2017-08-24 MED ORDER — ZOLPIDEM TARTRATE 5 MG PO TABS: 5.0000 mg | ORAL_TABLET | Freq: Every evening | ORAL | Status: DC | PRN

## 2017-08-24 MED ORDER — ACETAMINOPHEN 325 MG PO TABS: 650.0000 mg | ORAL_TABLET | ORAL | Status: DC | PRN

## 2017-08-24 MED ORDER — DIPHENHYDRAMINE HCL 50 MG/ML IJ SOLN: 12.5000 mg | INTRAMUSCULAR | Status: DC | PRN

## 2017-08-24 MED ORDER — LIDOCAINE HCL (PF) 1 % IJ SOLN
INTRAMUSCULAR | Status: DC | PRN
  Administered 2017-08-24 (×2): 4 mL

## 2017-08-24 MED ORDER — FENTANYL CITRATE (PF) 100 MCG/2ML IJ SOLN
INTRAMUSCULAR | Status: AC
Start: 2017-08-24 — End: 2017-08-24
  Filled 2017-08-24: qty 2

## 2017-08-24 MED ORDER — PRENATAL MULTIVITAMIN CH
1.0000 | ORAL_TABLET | Freq: Every day | ORAL | Status: DC
  Administered 2017-08-25 – 2017-08-26 (×2): 1 via ORAL
  Filled 2017-08-24: qty 1

## 2017-08-24 MED ORDER — SOD CITRATE-CITRIC ACID 500-334 MG/5ML PO SOLN
ORAL | Status: AC
  Administered 2017-08-24: 30 mL via ORAL
  Filled 2017-08-24: qty 15

## 2017-08-24 MED ORDER — OXYCODONE-ACETAMINOPHEN 5-325 MG PO TABS: 2.0000 | ORAL_TABLET | ORAL | Status: DC | PRN

## 2017-08-24 MED ORDER — OXYTOCIN 40 UNITS IN LACTATED RINGERS INFUSION - SIMPLE MED
2.5000 [IU]/h | INTRAVENOUS | Status: DC
  Filled 2017-08-24: qty 1000

## 2017-08-24 MED ORDER — DIBUCAINE 1 % RE OINT
1.0000 "application " | TOPICAL_OINTMENT | RECTAL | Status: DC | PRN
  Filled 2017-08-24: qty 28

## 2017-08-24 MED ORDER — SODIUM CHLORIDE 0.9 % IV SOLN
5.0000 10*6.[IU] | Freq: Once | INTRAVENOUS | Status: AC
  Administered 2017-08-24: 5 10*6.[IU] via INTRAVENOUS
  Filled 2017-08-24: qty 5

## 2017-08-24 MED ORDER — ONDANSETRON HCL 4 MG/2ML IJ SOLN: 4.0000 mg | Freq: Four times a day (QID) | INTRAMUSCULAR | Status: DC | PRN

## 2017-08-24 MED ORDER — MIDAZOLAM HCL 2 MG/2ML IJ SOLN
INTRAMUSCULAR | Status: DC | PRN
  Administered 2017-08-24: 2 mg via INTRAVENOUS

## 2017-08-24 MED ORDER — LACTATED RINGERS IV SOLN
INTRAVENOUS | Status: DC
  Administered 2017-08-24: 02:00:00 via INTRAVENOUS

## 2017-08-24 MED ORDER — BUPIVACAINE HCL (PF) 0.25 % IJ SOLN
INTRAMUSCULAR | Status: AC
  Filled 2017-08-24: qty 30

## 2017-08-24 MED ORDER — COCONUT OIL OIL
1.0000 "application " | TOPICAL_OIL | Status: DC | PRN
  Filled 2017-08-24: qty 120

## 2017-08-24 MED ORDER — KETOROLAC TROMETHAMINE 30 MG/ML IJ SOLN
INTRAMUSCULAR | Status: AC
  Filled 2017-08-24: qty 1

## 2017-08-24 MED ORDER — LACTATED RINGERS IV SOLN: 500.0000 mL | INTRAVENOUS | Status: DC | PRN

## 2017-08-24 MED ORDER — MEPERIDINE HCL 25 MG/ML IJ SOLN: 6.2500 mg | INTRAMUSCULAR | Status: DC | PRN

## 2017-08-24 MED ORDER — OXYCODONE-ACETAMINOPHEN 5-325 MG PO TABS: 1.0000 | ORAL_TABLET | ORAL | Status: DC | PRN

## 2017-08-24 MED ORDER — SODIUM BICARBONATE 8.4 % IV SOLN
INTRAVENOUS | Status: DC | PRN
  Administered 2017-08-24: 3 mL via EPIDURAL
  Administered 2017-08-24 (×2): 2 mL via EPIDURAL
  Administered 2017-08-24: 4 mL via EPIDURAL
  Administered 2017-08-24: 5 mL via EPIDURAL

## 2017-08-24 MED ORDER — PHENYLEPHRINE 40 MCG/ML (10ML) SYRINGE FOR IV PUSH (FOR BLOOD PRESSURE SUPPORT)
80.0000 ug | PREFILLED_SYRINGE | INTRAVENOUS | Status: DC | PRN
  Administered 2017-08-24 (×2): 80 ug via INTRAVENOUS

## 2017-08-24 MED ORDER — SODIUM CHLORIDE 0.9 % IR SOLN
Status: DC | PRN
  Administered 2017-08-24: 1000 mL

## 2017-08-24 MED ORDER — ONDANSETRON HCL 4 MG/2ML IJ SOLN: 4.0000 mg | INTRAMUSCULAR | Status: DC | PRN

## 2017-08-24 MED ORDER — FENTANYL CITRATE (PF) 100 MCG/2ML IJ SOLN: 100.0000 ug | Freq: Once | INTRAMUSCULAR | Status: DC

## 2017-08-24 MED ORDER — FENTANYL CITRATE (PF) 100 MCG/2ML IJ SOLN
100.0000 ug | INTRAMUSCULAR | Status: DC | PRN
  Administered 2017-08-24: 100 ug via INTRAVENOUS
  Filled 2017-08-24: qty 2

## 2017-08-24 MED ORDER — BENZOCAINE-MENTHOL 20-0.5 % EX AERO
1.0000 "application " | INHALATION_SPRAY | CUTANEOUS | Status: DC | PRN
  Filled 2017-08-24: qty 56

## 2017-08-24 MED ORDER — LACTATED RINGERS IV SOLN
INTRAVENOUS | Status: DC | PRN
  Administered 2017-08-24: 13:00:00 via INTRAVENOUS

## 2017-08-24 MED ORDER — FENTANYL 2.5 MCG/ML BUPIVACAINE 1/10 % EPIDURAL INFUSION (WH - ANES)
14.0000 mL/h | INTRAMUSCULAR | Status: DC | PRN
  Administered 2017-08-24: 14 mL/h via EPIDURAL
  Filled 2017-08-24 (×2): qty 100

## 2017-08-24 MED ORDER — LACTATED RINGERS IV SOLN: INTRAVENOUS | Status: DC

## 2017-08-24 MED ORDER — ONDANSETRON HCL 4 MG PO TABS
4.0000 mg | ORAL_TABLET | ORAL | Status: DC | PRN
Start: 2017-08-24 — End: 2017-08-26

## 2017-08-24 MED ORDER — HYDROCODONE-ACETAMINOPHEN 7.5-325 MG PO TABS: 1.0000 | ORAL_TABLET | Freq: Once | ORAL | Status: DC | PRN

## 2017-08-24 MED ORDER — SENNOSIDES-DOCUSATE SODIUM 8.6-50 MG PO TABS
2.0000 | ORAL_TABLET | ORAL | Status: DC
  Administered 2017-08-25 (×2): 2 via ORAL
  Filled 2017-08-24 (×2): qty 2

## 2017-08-24 MED ORDER — FENTANYL CITRATE (PF) 100 MCG/2ML IJ SOLN
INTRAMUSCULAR | Status: DC | PRN
  Administered 2017-08-24: 100 ug via INTRAVENOUS

## 2017-08-24 MED ORDER — IBUPROFEN 600 MG PO TABS
600.0000 mg | ORAL_TABLET | Freq: Four times a day (QID) | ORAL | Status: DC
  Administered 2017-08-25 – 2017-08-26 (×7): 600 mg via ORAL
  Filled 2017-08-24 (×7): qty 1

## 2017-08-24 MED ORDER — LIDOCAINE HCL (PF) 1 % IJ SOLN
30.0000 mL | INTRAMUSCULAR | Status: DC | PRN
  Filled 2017-08-24: qty 30

## 2017-08-24 MED ORDER — DIPHENHYDRAMINE HCL 25 MG PO CAPS: 25.0000 mg | ORAL_CAPSULE | Freq: Four times a day (QID) | ORAL | Status: DC | PRN

## 2017-08-24 MED ORDER — METOCLOPRAMIDE HCL 5 MG/ML IJ SOLN: 10.0000 mg | Freq: Once | INTRAMUSCULAR | Status: DC | PRN

## 2017-08-24 MED ORDER — SOD CITRATE-CITRIC ACID 500-334 MG/5ML PO SOLN: 30.0000 mL | ORAL | Status: DC | PRN

## 2017-08-24 MED ORDER — LACTATED RINGERS IV SOLN: 500.0000 mL | Freq: Once | INTRAVENOUS | Status: DC

## 2017-08-24 MED ORDER — FENTANYL 2.5 MCG/ML BUPIVACAINE 1/10 % EPIDURAL INFUSION (WH - ANES): 14.0000 mL/h | INTRAMUSCULAR | Status: DC | PRN

## 2017-08-24 MED ORDER — HYDROMORPHONE HCL 1 MG/ML IJ SOLN: 0.2500 mg | INTRAMUSCULAR | Status: DC | PRN

## 2017-08-24 MED ORDER — TETANUS-DIPHTH-ACELL PERTUSSIS 5-2.5-18.5 LF-MCG/0.5 IM SUSP
0.5000 mL | Freq: Once | INTRAMUSCULAR | Status: DC
  Filled 2017-08-24: qty 0.5

## 2017-08-24 MED ORDER — OXYTOCIN BOLUS FROM INFUSION: 500.0000 mL | Freq: Once | INTRAVENOUS | Status: DC

## 2017-08-24 MED ORDER — WITCH HAZEL-GLYCERIN EX PADS: 1.0000 "application " | MEDICATED_PAD | CUTANEOUS | Status: DC | PRN

## 2017-08-24 SURGICAL SUPPLY — 24 items
CLIP FILSHIE TUBAL LIGA STRL (Clip) ×2 IMPLANT
CLOTH BEACON ORANGE TIMEOUT ST (SAFETY) ×2 IMPLANT
DECANTER SPIKE VIAL GLASS SM (MISCELLANEOUS) ×2 IMPLANT
DRSG OPSITE POSTOP 3X4 (GAUZE/BANDAGES/DRESSINGS) ×2 IMPLANT
DRSG OPSITE POSTOP 4X10 (GAUZE/BANDAGES/DRESSINGS) ×2 IMPLANT
DURAPREP 26ML APPLICATOR (WOUND CARE) ×2 IMPLANT
GLOVE BIO SURGEON STRL SZ7.5 (GLOVE) ×2 IMPLANT
GLOVE BIOGEL PI IND STRL 7.0 (GLOVE) ×2 IMPLANT
GLOVE BIOGEL PI INDICATOR 7.0 (GLOVE) ×2
GOWN STRL REUS W/TWL LRG LVL3 (GOWN DISPOSABLE) ×2 IMPLANT
GOWN STRL REUS W/TWL XL LVL3 (GOWN DISPOSABLE) ×2 IMPLANT
NEEDLE HYPO 22GX1.5 SAFETY (NEEDLE) ×4 IMPLANT
NS IRRIG 1000ML POUR BTL (IV SOLUTION) ×2 IMPLANT
PACK ABDOMINAL MINOR (CUSTOM PROCEDURE TRAY) ×2 IMPLANT
PROTECTOR NERVE ULNAR (MISCELLANEOUS) ×2 IMPLANT
SPONGE LAP 4X18 X RAY DECT (DISPOSABLE) IMPLANT
SUT MNCRL AB 4-0 PS2 18 (SUTURE) ×2 IMPLANT
SUT PLAIN 0 NONE (SUTURE) ×2 IMPLANT
SUT VIC AB 0 CT1 27 (SUTURE) ×1
SUT VIC AB 0 CT1 27XBRD ANBCTR (SUTURE) ×1 IMPLANT
SYR CONTROL 10ML LL (SYRINGE) ×4 IMPLANT
TOWEL OR 17X24 6PK STRL BLUE (TOWEL DISPOSABLE) ×4 IMPLANT
TRAY FOLEY CATH SILVER 14FR (SET/KITS/TRAYS/PACK) ×2 IMPLANT
WATER STERILE IRR 1000ML POUR (IV SOLUTION) ×2 IMPLANT

## 2017-08-24 NOTE — Anesthesia Preprocedure Evaluation (Signed)
Anesthesia Evaluation  Patient identified by MRN, date of birth, ID band Patient awake    Reviewed: Allergy & Precautions, NPO status , Patient's Chart, lab work & pertinent test results  Airway Mallampati: II  TM Distance: >3 FB Neck ROM: Full    Dental  (+) Teeth Intact, Dental Advisory Given   Pulmonary neg pulmonary ROS,    Pulmonary exam normal breath sounds clear to auscultation       Cardiovascular negative cardio ROS Normal cardiovascular exam Rhythm:Regular Rate:Normal     Neuro/Psych negative neurological ROS  negative psych ROS   GI/Hepatic negative GI ROS, Neg liver ROS,   Endo/Other  Morbid obesity  Renal/GU negative Renal ROS     Musculoskeletal negative musculoskeletal ROS (+)   Abdominal (+) + obese,   Peds  Hematology  (+) Blood dyscrasia, Sickle cell trait , Plt 202k   Anesthesia Other Findings   Reproductive/Obstetrics Desires Sterilization post partum                             Anesthesia Physical  Anesthesia Plan  ASA: III  Anesthesia Plan: Epidural   Post-op Pain Management:    Induction:   PONV Risk Score and Plan: 4 or greater and Scopolamine patch - Pre-op, Propofol infusion, Ondansetron and Dexamethasone  Airway Management Planned: Natural Airway  Additional Equipment:   Intra-op Plan:   Post-operative Plan:   Informed Consent: I have reviewed the patients History and Physical, chart, labs and discussed the procedure including the risks, benefits and alternatives for the proposed anesthesia with the patient or authorized representative who has indicated his/her understanding and acceptance.   Dental advisory given  Plan Discussed with: Anesthesiologist, CRNA and Surgeon  Anesthesia Plan Comments:         Anesthesia Quick Evaluation

## 2017-08-24 NOTE — Anesthesia Postprocedure Evaluation (Signed)
Anesthesia Post Note  Patient: Ashley Wilkerson  Procedure(s) Performed: AN AD HOC LABOR EPIDURAL     Patient location during evaluation: Mother Baby Anesthesia Type: Epidural Level of consciousness: awake and alert Pain management: pain level controlled Vital Signs Assessment: post-procedure vital signs reviewed and stable Respiratory status: spontaneous breathing, nonlabored ventilation and respiratory function stable Cardiovascular status: stable Postop Assessment: no headache, no backache and epidural receding Anesthetic complications: no    Last Vitals:  Vitals:   08/24/17 1540 08/24/17 1656  BP: 119/67 121/66  Pulse: 87 85  Resp: 18   Temp: 36.8 C 37.1 C  SpO2: 100% 100%    Last Pain:  Vitals:   08/24/17 1656  TempSrc:   PainSc: 0-No pain   Pain Goal:                 Junious SilkGILBERT,Shyheim Tanney

## 2017-08-24 NOTE — H&P (Signed)
LABOR AND DELIVERY ADMISSION HISTORY AND PHYSICAL NOTE  Ashley Wilkerson is a 37 y.o. female [phone] with IUP at [redacted]w[redacted]d by LMP presenting for preterm labor. She presented to MAU with regular painful contractions every 3-4 minutes since 8 pm last night. She has hx of cervical incompetence with 20-week loss last pregnancy and she had cervical cerclage placed at 14-weeks this pregnancy. She reports positive fetal movement. She denies leakage of fluid or vaginal bleeding.  In MAU she was noted to have frequent contractions and be very uncomfortable with it. Cerclage was removed by Dr Earlene Plater, and over the last 4 hours cervix has changed form 1 cm to 4 cm.    Prenatal History/Complications: PNC at [facility] Pregnancy complications:  Patient Active Problem List   Diagnosis Date Noted  . Sickle cell trait (HCC) 02/13/2017  . Short interval between pregnancies affecting pregnancy in first trimester, antepartum 02/10/2017  . History of cervical incompetence in pregnancy, currently pregnant 02/10/2017  . Obesity (BMI 30-39.9) 02/10/2017  . Obesity in pregnancy 02/10/2017  . Supervision of high risk pregnancy, antepartum 02/08/2017  . 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 Rh negative status during pregnancy 04/14/2016  . AMA (advanced maternal age) multigravida 35+ 04/09/2016  . Maternal condyloma acuminata complicating pregnancy in second trimester 04/09/2016     Past Medical History: Past Medical History:  Diagnosis Date  . Abnormal Pap smear   . Prior pregnancy with fetal demise 04/2016   [redacted] weeks gestation   . SVD (spontaneous vaginal delivery)    x 3 - 2 living, 1 fetal demise at [redacted] wks gestation  . Vaginal Pap smear, abnormal     Past Surgical History: Past Surgical History:  Procedure Laterality Date  . CERVICAL CERCLAGE  04/2016   [redacted] wks gestation - fetal demise  . CERVICAL CERCLAGE N/A 03/26/2017   Procedure: CERCLAGE CERVICAL;  Surgeon: Tereso Newcomer, MD;   Location: [facility];  Service: Gynecology;  Laterality: N/A;  . CRYOTHERAPY    . WISDOM TOOTH EXTRACTION      Obstetrical History: OB History    Gravida  4   Para  3   Term  2   Preterm  0   AB      Living  2     SAB      TAB      Ectopic      Multiple  0   Live Births  2           Social History: Social History   Socioeconomic History  . Marital status: Married    Spouse name: Not on file  . Number of children: Not on file  . Years of education: Not on file  . Highest education level: Not on file  Occupational History  . Not on file  Social Needs  . Financial resource strain: Not on file  . Food insecurity:    Worry: Not on file    Inability: Not on file  . Transportation needs:    Medical: Not on file    Non-medical: Not on file  Tobacco Use  . Smoking status: Never Smoker  . Smokeless tobacco: Never Used  Substance and Sexual Activity  . Alcohol use: No  . Drug use: No  . Sexual activity: Yes    Birth control/protection: None    Comment: approx [redacted] wks gestation per patient  Lifestyle  . Physical activity:    Days per week: Not on file  Minutes per session: Not on file  . Stress: Not on file  Relationships  . Social connections:    Talks on phone: Not on file    Gets together: Not on file    Attends religious service: Not on file    Active member of club or organization: Not on file    Attends meetings of clubs or organizations: Not on file    Relationship status: Not on file  Other Topics Concern  . Not on file  Social History Narrative  . Not on file    Family History: Family History  Problem Relation Age of Onset  . Hyperlipidemia Mother   . Hypertension Mother   . Cancer Neg Hx   . Birth defects Neg Hx     Allergies: No Known Allergies  Medications Prior to Admission  Medication Sig Dispense Refill Last Dose  . aspirin EC 81 MG tablet Take 1 tablet (81 mg total) by mouth daily. 60 tablet 3 08/23/2017 at Unknown time   . Prenatal Vit-Fe Fumarate-FA (MULTIVITAMIN-PRENATAL) 27-0.8 MG TABS tablet Take 1 tablet by mouth daily at 12 noon.   08/23/2017 at Unknown time  . docusate sodium (COLACE) 100 MG capsule Take 1 capsule (100 mg total) by mouth 2 (two) times daily as needed. 30 capsule 2 Taking     Review of Systems  All systems reviewed and negative except as stated in HPI  Physical Exam Blood pressure 137/67, pulse 96, temperature 98.4 F (36.9 C), temperature source Oral, resp. rate 20, last menstrual period 11/20/2016. General appearance: alert, oriented, appears very uncomfortable with contractions Lungs: normal respiratory effort Heart: regular rate Abdomen: soft, non-tender; gravid, FH appropriate for GA Extremities: No calf swelling or tenderness Presentation: cephalic by bedside U/S Fetal monitoring: baseline rate 120, moderate variability, +acel, no decel Uterine activity: ctx q 1-3 min SVE: Dilation: 4 Effacement (%): 90 Station: -2 Exam by:: Steward DroneVeronica Rogers CNM  Prenatal labs: ABO, Rh: O/Negative/-- (09/11 0933) Antibody: Negative (01/24 0906) Rubella: 9.10 (09/11 0933) RPR: Non Reactive (01/24 0906)  HBsAg: Negative (09/11 0933)  HIV: Non Reactive (01/24 0906)  GC/Chlamydia: negative GBS:   n/a  2-hr GTT: normal Genetic screening:  Low risk NIPS Anatomy US: normal anatomy  Prenatal Transfer Tool  Maternal Diabetes: No Genetic Screening: Normal Maternal Ultrasounds/Referrals: Normal Fetal Ultrasounds or other Referrals:  None Maternal Substance Abuse:  No Significant Maternal Medications:  Meds include: Other: ASA Significant Maternal Lab Results: None  Results for orders placed or performed during the hospital encounter of 08/24/17 (from the past 24 hour(s))  Urinalysis, Routine w reflex microscopic   Collection Time: 08/24/17 12:51 AM  Result Value Ref Range   Color, Urine AMBER (A) YELLOW   APPearance CLOUDY (A) CLEAR   Specific Gravity, Urine 1.017 1.005 - 1.030    pH 5.0 5.0 - 8.0   Glucose, UA NEGATIVE NEGATIVE mg/dL   Hgb urine dipstick NEGATIVE NEGATIVE   Bilirubin Urine NEGATIVE NEGATIVE   Ketones, ur 80 (A) NEGATIVE mg/dL   Protein, ur NEGATIVE NEGATIVE mg/dL   Nitrite NEGATIVE NEGATIVE   Leukocytes, UA NEGATIVE NEGATIVE  Protein / creatinine ratio, urine   Collection Time: 08/24/17 12:51 AM  Result Value Ref Range   Creatinine, Urine 202.00 mg/dL   Total Protein, Urine 25 mg/dL   Protein Creatinine Ratio 0.12 0.00 - 0.15 mg/mg[Cre]  CBC   Collection Time: 08/24/17  2:21 AM  Result Value Ref Range   WBC 10.4 4.0 - 10.5 K/uL   RBC  4.60 3.87 - 5.11 MIL/uL   Hemoglobin 13.4 12.0 - 15.0 g/dL   HCT 16.1 09.6 - 04.5 %   MCV 84.1 78.0 - 100.0 fL   MCH 29.1 26.0 - 34.0 pg   MCHC 34.6 30.0 - 36.0 g/dL   RDW 40.9 81.1 - 91.4 %   Platelets 206 150 - 400 K/uL  Comprehensive metabolic panel   Collection Time: 08/24/17  2:43 AM  Result Value Ref Range   Sodium 135 135 - 145 mmol/L   Potassium 3.4 (L) 3.5 - 5.1 mmol/L   Chloride 104 101 - 111 mmol/L   CO2 19 (L) 22 - 32 mmol/L   Glucose, Bld 100 (H) 65 - 99 mg/dL   BUN 5 (L) 6 - 20 mg/dL   Creatinine, Ser 7.82 0.44 - 1.00 mg/dL   Calcium 8.8 (L) 8.9 - 10.3 mg/dL   Total Protein 6.2 (L) 6.5 - 8.1 g/dL   Albumin 3.0 (L) 3.5 - 5.0 g/dL   AST 34 15 - 41 U/L   ALT 23 14 - 54 U/L   Alkaline Phosphatase 184 (H) 38 - 126 U/L   Total Bilirubin 0.5 0.3 - 1.2 mg/dL   GFR calc non Af Amer >60 >60 mL/min   GFR calc Af Amer >60 >60 mL/min   Anion gap 12 5 - 15    Patient Active Problem List   Diagnosis Date Noted  . Sickle cell trait (HCC) 02/13/2017  . Short interval between pregnancies affecting pregnancy in first trimester, antepartum 02/10/2017  . History of cervical incompetence in pregnancy, currently pregnant 02/10/2017  . Obesity (BMI 30-39.9) 02/10/2017  . Obesity in pregnancy 02/10/2017  . Supervision of high risk pregnancy, antepartum 02/08/2017  . 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 Rh  negative status during pregnancy 04/14/2016  . AMA (advanced maternal age) multigravida 35+ 04/09/2016  . Maternal condyloma acuminata complicating pregnancy in second trimester 04/09/2016    Assessment: CATHERIN DOORN is a 37 y.o. [id number] at [redacted]w[redacted]d here for preterm labor.  Cerclage removed in MAU by Dr. Earlene Plater  #Preterm Labor: expectant mangement. BMZ x 1given  #Pain: Will get epidural #FWB: Cat I #ID:  GBS unk; will collect culture and give IAP #MOF: breast #MOC: BTL (papers signed 06/25/17) #Circ:  N/a (girl)  Kandra Nicolas Tattianna Schnarr 08/24/2017, 3:40 AM

## 2017-08-24 NOTE — Progress Notes (Signed)
Labor Progress Note Eloise HarmanShareka M [place] is a 37 y.o. [id number] at 6175w6d presented for PTL, cerclage removed in MAU.  S: Patient without questions or concerns. Comfortable with epidural.   O:  BP 139/79   Pulse (!) 141   Temp 98.4 F (36.9 C) (Oral)   Resp 16   Ht 4\' 11"  (1.499 m)   Wt 99.3 kg (219 lb)   LMP 11/20/2016   SpO2 98%   BMI 44.23 kg/m   XBJ:YNWGNFAOFHT:baseline rate 120, moderate variability, + acels, no decels  Toco: q3-575min, irregular  CVE: Dilation: 7 Effacement (%): 80 Cervical Position: Middle Station: -1 Presentation: Vertex Exam by:: Dr. Doroteo GlassmanPhelps  A&P: 37 y.o. [id number] 2975w6d here for PTL. Active labor  Labor: Progressing well. AROM'd for augmentation. Continue expectant management. Pain: Epidural FWB: Cat I  Caryl AdaJazma Zahria Ding, DO 10:26 AM

## 2017-08-24 NOTE — Progress Notes (Signed)
Patient desires permanent sterilization.  Other reversible forms of contraception were discussed with patient; she declines all other modalities. Risks of procedure discussed with patient including but not limited to: risk of regret, permanence of method, bleeding, infection, injury to surrounding organs and need for additional procedures.  Failure risk of 1-2 % with increased risk of ectopic gestation if pregnancy occurs was also discussed with patient.  Patient verbalized understanding of these risks and wants to proceed with sterilization.  Written informed consent obtained.  To OR when ready.  Caryl AdaJazma Indira Sorenson, DO OB Fellow Faculty Practice, [facility] 08/24/2017, 12:32 PM

## 2017-08-24 NOTE — Telephone Encounter (Signed)
Called and left voicemail on the cell phone that all OB appointments have been cancelled and that postpartum is scheduled for 09/22/17 @ 10:00 with Dr Shawnie PonsPratt

## 2017-08-24 NOTE — H&P (Deleted)
duplicate

## 2017-08-24 NOTE — Anesthesia Postprocedure Evaluation (Signed)
Anesthesia Post Note  Patient: Ashley Wilkerson  Procedure(s) Performed: POST PARTUM TUBAL LIGATION (Bilateral )     Patient location during evaluation: PACU Anesthesia Type: Epidural Level of consciousness: awake and alert and oriented Pain management: pain level controlled Vital Signs Assessment: post-procedure vital signs reviewed and stable Respiratory status: spontaneous breathing, nonlabored ventilation and respiratory function stable Cardiovascular status: blood pressure returned to baseline and stable Postop Assessment: no backache, no headache, epidural receding, no apparent nausea or vomiting and patient able to bend at knees Anesthetic complications: no    Last Vitals:  Vitals:   08/24/17 1430 08/24/17 1445  BP: 106/60 (!) 112/58  Pulse: (!) 107 85  Resp: 18 (!) 22  Temp:    SpO2: 98% 98%    Last Pain:  Vitals:   08/24/17 1430  TempSrc:   PainSc: 0-No pain   Pain Goal:                 Everton Bertha A.

## 2017-08-24 NOTE — Progress Notes (Signed)
Labor Progress Note Ashley Wilkerson is a 37 y.o. [id number] at 4363w6d presented for PTL, cerclage removed in MAU.  S: Patient without questions or concerns  O:  BP 122/61   Pulse 90   Temp 98.6 F (37 C) (Oral)   Resp 18   Ht 4\' 11"  (1.499 m)   Wt 219 lb (99.3 kg)   LMP 11/20/2016   SpO2 98%   BMI 44.23 kg/m   VQQ:VZDGLOVFFHT:baseline rate 135, moderate variability, + acels, no decels  Toco: ctx not tracing well  CVE: Dilation: 5 Effacement (%): 90 Cervical Position: Middle Station: -2 Presentation: Vertex Exam by:: Degele   A&P: 37 y.o. [id number] 7463w6d here for PTL. Labor: Progressing well. Continue expectant management. Pain: Epidural FWB: Cat I  Ellwood DenseAlison Rumball, DO 5:27 AM

## 2017-08-24 NOTE — Transfer of Care (Signed)
Immediate Anesthesia Transfer of Care Note  Patient: Ashley Wilkerson  Procedure(s) Performed: POST PARTUM TUBAL LIGATION (Bilateral )  Patient Location: PACU  Anesthesia Type:Epidural  Level of Consciousness: awake, alert  and oriented  Airway & Oxygen Therapy: Patient Spontanous Breathing and Patient connected to nasal cannula oxygen  Post-op Assessment: Report given to RN and Post -op Vital signs reviewed and stable  Post vital signs: Reviewed and stable  Last Vitals:  Vitals Value Taken Time  BP 113/59 08/24/2017  2:08 PM  Temp    Pulse 100 08/24/2017  2:10 PM  Resp 8 08/24/2017  2:10 PM  SpO2 97 % 08/24/2017  2:10 PM  Vitals shown include unvalidated device data.  Last Pain:  Vitals:   08/24/17 1045  TempSrc:   PainSc: 7          Complications: No apparent anesthesia complications

## 2017-08-24 NOTE — MAU Note (Signed)
Pt reports to MAU c/o ctxs every 3-844min. Pt stated the ctx started at about 2000 and have since then worsened. Pt denies LOF or bleeding and reports +FM. PT has a cerclage in place due to a previous 20week loss.

## 2017-08-24 NOTE — Lactation Note (Signed)
This note was copied from a baby's chart. Lactation Consultation Note  Patient Name: Ashley Wilkerson UJWJX'BToday's Date: 08/24/2017 Reason for consult: Initial assessment;Late-preterm 34-36.6wks  Initial visit at 10 hours of life. This is Mom's 4th birth (Mom had a 20-week demise last year). Mom reports that her milk came to volume after all of her births. This is Mom's 1st time breastfeeding.  LPI feeding policy was described. Mom understands that infant needs to be supplemented. She has pumped, but has had no yield. I asked Mom how she would like me to supplement her daughter. Mom seemed hesitant to bottle feed, so I offered to cup feed her. However, this infant was not a willing cup feeder. With parents' permission, we bottle fed her and she did quite well. I showed parents how to provide gentle chin support.  Mom was shown volume parameters based on day of life, but Mom understands that they are to feed infant until content. Formula will be changed to Neosure 22. Lurline HareRichey, [facility]erson [facility] 08/24/2017, 10:03 PM

## 2017-08-24 NOTE — Op Note (Signed)
Ashley SabinsShareka M Wilkerson 08/24/2017  PREOPERATIVE DIAGNOSES: Multiparity, undesired fertility  POSTOPERATIVE DIAGNOSES: Multiparity, undesired fertility  PROCEDURE:  Postpartum Bilateral Tubal Sterilization   SURGEON: Dr. Casimiro NeedleMichael L. Ervin and Dr. Caryl AdaJazma Phelps  ANESTHESIA:  Epidural and local analgesia using 10 ml of 0.5% Marcaine  COMPLICATIONS:  None immediate.  ESTIMATED BLOOD LOSS: 5 ml.  FLUIDS: 400 ml LR.  URINE OUTPUT:  400 ml of clear urine.  INDICATIONS:  37 y.o. [name] with undesired fertility,status post vaginal delivery, desires permanent sterilization.  Other reversible forms of contraception were discussed with patient; she declines all other modalities. Risks of procedure discussed with patient including but not limited to: risk of regret, permanence of method, bleeding, infection, injury to surrounding organs and need for additional procedures.  Failure risk of 1 -2 % with increased risk of ectopic gestation if pregnancy occurs was also discussed with patient.      FINDINGS:  Normal uterus, tubes, and ovaries.  PROCEDURE DETAILS: The patient was taken to the operating room where her epidural anesthesia was dosed up to surgical level and found to be adequate.  She was then placed in the dorsal supine position and prepped and draped in sterile fashion.  After an adequate timeout was performed, attention was turned to the patient's abdomen where a small transverse skin incision was made under the umbilical fold. The incision was taken down to the layer of fascia using the scalpel, and fascia was incised, and extended bilaterally using Mayo scissors. The peritoneum was entered in a sharp fashion. Attention was then turned to the patient's uterus, and left fallopian tube was identified and followed out to the fimbriated end.  A Filshie clip was placed on the left fallopian tube about 3 cm from the cornual attachment, with care given to incorporate the underlying mesosalpinx.  A  similar process was carried out on the right side allowing for bilateral tubal sterilization.  Good hemostasis was noted overall.  The instruments were then removed from the patient's abdomen and the fascial incision was repaired with 0 Vicryl, and the skin was closed with a 4-0 Vicryl subcuticular stitch. Local analgesia was injected into incision. The patient tolerated the procedure well.  Instrument, sponge, and needle counts were correct times two.  The patient was then taken to the recovery room awake and in stable condition.  Caryl AdaJazma Phelps, DO OB Fellow Center for [facility], [facility]

## 2017-08-24 NOTE — Anesthesia Preprocedure Evaluation (Signed)
Anesthesia Evaluation  Patient identified by MRN, date of birth, ID band Patient awake    Reviewed: Allergy & Precautions, NPO status , Patient's Chart, lab work & pertinent test results  Airway Mallampati: II  TM Distance: >3 FB Neck ROM: Full    Dental  (+) Teeth Intact, Dental Advisory Given   Pulmonary neg pulmonary ROS,    Pulmonary exam normal breath sounds clear to auscultation       Cardiovascular negative cardio ROS Normal cardiovascular exam Rhythm:Regular Rate:Normal     Neuro/Psych negative neurological ROS  negative psych ROS   GI/Hepatic negative GI ROS, Neg liver ROS,   Endo/Other  Morbid obesity  Renal/GU negative Renal ROS     Musculoskeletal negative musculoskeletal ROS (+)   Abdominal   Peds  Hematology  (+) Blood dyscrasia, Sickle cell trait , Plt 202k   Anesthesia Other Findings Day of surgery medications reviewed with the patient.  Reproductive/Obstetrics (+) Pregnancy                             Anesthesia Physical  Anesthesia Plan  ASA: III  Anesthesia Plan: Epidural   Post-op Pain Management:    Induction:   PONV Risk Score and Plan:   Airway Management Planned:   Additional Equipment:   Intra-op Plan:   Post-operative Plan:   Informed Consent: I have reviewed the patients History and Physical, chart, labs and discussed the procedure including the risks, benefits and alternatives for the proposed anesthesia with the patient or authorized representative who has indicated his/her understanding and acceptance.     Plan Discussed with:   Anesthesia Plan Comments:         Anesthesia Quick Evaluation

## 2017-08-24 NOTE — Anesthesia Procedure Notes (Signed)
Epidural Patient location during procedure: OB Start time: 08/24/2017 4:25 AM End time: 08/24/2017 4:35 AM  Staffing Anesthesiologist: Lewie LoronGermeroth, Dravon Nott, MD Performed: anesthesiologist   Preanesthetic Checklist Completed: patient identified, pre-op evaluation, timeout performed, IV checked, risks and benefits discussed and monitors and equipment checked  Epidural Patient position: sitting Prep: site prepped and draped and DuraPrep Patient monitoring: heart rate, continuous pulse ox and blood pressure Approach: midline Location: L3-L4 Injection technique: LOR air and LOR saline  Needle:  Needle type: Tuohy  Needle gauge: 17 G Needle length: 9 cm Needle insertion depth: 7 cm Catheter type: closed end flexible Catheter size: 19 Gauge Catheter at skin depth: 12 cm Test dose: negative  Assessment Sensory level: T8 Events: blood not aspirated, injection not painful, no injection resistance, negative IV test and no paresthesia  Additional Notes Reason for block:procedure for pain

## 2017-08-25 ENCOUNTER — Encounter: Payer: Medicaid Other | Admitting: Obstetrics & Gynecology

## 2017-08-25 ENCOUNTER — Encounter (HOSPITAL_COMMUNITY): Payer: Self-pay | Admitting: Obstetrics and Gynecology

## 2017-08-25 LAB — KLEIHAUER-BETKE STAIN
# VIALS RHIG: 1
Fetal Cells %: 0 %
Quantitation Fetal Hemoglobin: 0 mL

## 2017-08-25 MED ORDER — RHO D IMMUNE GLOBULIN 1500 UNIT/2ML IJ SOSY
300.0000 ug | PREFILLED_SYRINGE | Freq: Once | INTRAMUSCULAR | Status: AC
  Administered 2017-08-25: 300 ug via INTRAVENOUS
  Filled 2017-08-25: qty 2

## 2017-08-25 NOTE — Lactation Note (Signed)
This note was copied from a baby's chart. Lactation Consultation Note  Patient Name: Ashley Wilkerson ZOXWR'UToday's Date: 08/25/2017 Reason for consult: Follow-up assessment;Infant < 6lbs;Late-preterm 34-36.6wks  Visited with Mom of 1673w6d baby at 31 hrs.  Baby has been very sleepy, and having trouble with breast and bottle feeding.  RN swaddled baby and fed in sidelying position, gave chin support, burped before feeding and during.  Baby had just been taking 4 ml.  Ped requested a Speech Therapy Consult tomorrow.    Baby had a large meconium stool, and woke up and Mom was able to feed baby a total of 9ml at last feeding.    Encouraged Mom to keep baby STS, and try to feed baby every 2-3 hrs.  DEBP at bedside.  Mom states she tried to pump, but nothing came out.  Discussed importance of regular pumping ( >8 times per 24 hrs).  Reassured Mom that it is normal to not get anything early on, but with breast massage, hand expression, baby STS, and frequent pumping, her milk volume would come in.  Reviewed how to pump, and cleaning of pump parts.   Asked about a pump for home.  Mom has WIC.  [facility]. Interventions Interventions: Skin to skin;Hand express;Breast massage;DEBP  Lactation Tools Discussed/Used Tools: Pump;Bottle Breast pump type: Double-Electric Breast Pump   Consult Status Consult Status: Follow-up Date: 08/26/17 Follow-up type: In-patient    Ashley Wilkerson, Ashley Wilkerson E 08/25/2017, 6:37 PM

## 2017-08-25 NOTE — Plan of Care (Signed)
Progressing appropriately. Encouraged to call for assistance with feeding as needed.

## 2017-08-25 NOTE — Progress Notes (Signed)
POSTPARTUM PROGRESS NOTE  Post Partum Day #1 Subjective:  Ashley Wilkerson is a 37 y.o. [id number] 7066w6d s/p SVD and BTL.  No acute events overnight.  Pt denies problems with ambulating, voiding, passing gas, or po intake.  She denies nausea or vomiting.  Denies fevers or chills. Pain is well controlled. States she is only in minimal pain at the incision site when in certain positions. Vaginal bleeding is still present but states is markedly decreased from yesterday. No clots accompany the vaginal bleeding.  She is planning on breastfeeding. Plan for postpartum birth control is BTL, which was performed yesterday.   Objective: Blood pressure (!) 120/52, pulse 91, temperature 97.7 F (36.5 C), resp. rate 18, height 4\' 11"  (1.499 m), weight 99.3 kg (219 lb), last menstrual period 11/20/2016, SpO2 98 %, unknown if currently breastfeeding.  Physical Exam:  General: alert, cooperative and no distress Lochia:normal flow Chest: no respiratory distress Heart:regular rate, distal pulses intact Abdomen: soft, nontender. Incision site is clean, dry, and there are no signs of infection.  Uterine Fundus: firm, appropriately tender DVT Evaluation: No calf swelling or tenderness Extremities: no edema  Recent Labs    08/24/17 0221  HGB 13.4  HCT 38.7    Assessment/Plan:  ASSESSMENT: Ashley Wilkerson is a 37 y.o. [id number] 6566w6d s/p SVD and BTL. She is doing well currently and is without complaints.   Plan for discharge tomorrow, Breastfeeding and Contraception BTL    LOS: 1 day   [facility] Talma Wilkerson , PA-S 08/25/2017, 7:42 AM

## 2017-08-26 LAB — RH IG WORKUP (INCLUDES ABO/RH)
ABO/RH(D): O NEG
Gestational Age(Wks): 35
UNIT DIVISION: 0

## 2017-08-26 NOTE — Lactation Note (Signed)
This note was copied from a baby's chart. Lactation Consultation Note  Patient Name: Ashley Wilkerson ZOXWR'UToday's Date: 08/26/2017 Reason for consult: Follow-up assessment;Infant < 6lbs;Late-preterm 34-36.6wks;Difficult latch  G4 mom (This is mom's first LPI) with a LPI who is now 4246 hours old and ready for discharge.  Infant laying in basinette upon LC arrival Baby last fed approximately 3 1/2 hours ago by bottle.  LC offered to wake infant up and to assist with latch.  Mom somewhat hesitant and stated that the baby has not breastfed yet.  LC acknowledged but encouraged mom to allow help to see if infant would latch.  Infant aroused and awakened by LC.  Assisted mom with football hold on the right breast.  Infant with difficulty opening mouth and drowsy at breast.  Encouraged to open with a taste of formula.  Infant latched but not actively sucking.  SNS with 5 F feeding tube used and infant sucked 12 mls of formula at breast.  Lips were flanged after LC assistance.  Infant relaxed and drowsy after feeding.  Diaper changed and infant became more alert.  LC offered to assist with another latch on the left breast.  This time cross cradle position was used and mom instructed on how to continue breast massage, breast compression and to encourage infant to have a wide mouth upon latching.  The left breast is not as compressible as the right breast but infant did obtain a latch with a size 24 nipple shield.  Infant not willing to suck effectively so formula used with SNS and the shield.  At this feeding infant received a total of 8 mls of formula.    Infant's diaper changed and a void and stool noted.  [facility] preceptor, Idamae LusherMargaret Iorio, showed mother how to pace feed and fed infant 5 additional mls of formula.    LC to initiate a one week OP visit for mom with her permission.  Reviewed with mom about hand expression and to feed/pump every 2 1/2 - 3 hours at home.  Awaken infant at the 3 hour interval  if she has not self awakened.  Mother verbalized understanding and stated that she had her husband and other family members that can help at home.  WIC referral placed yesterday by LC.   Maternal Data Has patient been taught Hand Expression?: Yes Does the patient have breastfeeding experience prior to this delivery?: Yes  Feeding Feeding Type: Formula Nipple Type: Slow - flow Length of feed: 5 min  LATCH Score Latch: Repeated attempts needed to sustain latch, nipple held in mouth throughout feeding, stimulation needed to elicit sucking reflex.  Audible Swallowing: Spontaneous and intermittent  Type of Nipple: Everted at rest and after stimulation  Comfort (Breast/Nipple): Soft / non-tender  Hold (Positioning): Assistance needed to correctly position infant at breast and maintain latch.  LATCH Score: 8  Interventions Interventions: Breast feeding basics reviewed;Assisted with latch;Skin to skin;Breast massage;Hand express;Breast compression;Shells;Expressed milk;Position options;Support pillows;Adjust position;DEBP  Lactation Tools Discussed/Used Tools: Shells;Pump;Flanges;Nipple Dorris CarnesShields;Supplemental Nutrition System;40F feeding tube / Syringe;Bottle Nipple shield size: 24 Flange Size: 27 Shell Type: Inverted Breast pump type: Double-Electric Breast Pump WIC Program: Yes   Consult Status Consult Status: Complete Date: 08/26/17 Follow-up type: In-patient    Veleta Yamamoto R Johnwilliam Shepperson 08/26/2017, 10:07 AM

## 2017-08-26 NOTE — Discharge Summary (Signed)
OB Discharge Summary     Patient Name: Ashley Wilkerson DOB: Sep 23, 1980 MRN: [id number]  Date of admission: 08/24/2017 Delivering MD: Caryl Ada Y   Date of discharge: 08/26/2017  Admitting diagnosis: 35 WEEKS CTX Intrauterine pregnancy: [redacted]w[redacted]d     Secondary diagnosis:  Active Problems:   AMA (advanced maternal age) multigravida 35+   Rh negative status during pregnancy   Supervision of high risk pregnancy, antepartum   History of cervical incompetence in pregnancy, currently pregnant   Obesity in pregnancy   SVD (spontaneous vaginal delivery)  Additional problems: HPV     Discharge diagnosis: Preterm Pregnancy Delivered                                                                                                Post partum procedures:postpartum tubal ligation  Augmentation: AROM  Complications: None  Hospital course:  Onset of Labor With Vaginal Delivery     37 y.o. yo [id number] at [redacted]w[redacted]d was admitted in Preterm labor with Cerclage on 08/24/2017. Cerclage removed in MAU on 08/24/2017. Patient had an uncomplicated labor course as follows:  Membrane Rupture Time/Date: 10:20 AM ,08/24/2017   Intrapartum Procedures: Episiotomy: None [1]                                         Lacerations:  None [1]  Patient had a delivery of a Viable infant. 08/24/2017  Information for the patient's newborn:  Trudie Buckler Girl Charlen [[id number]]  Delivery Method: Vaginal, Spontaneous(Filed from Delivery Summary)    Pateint had an uncomplicated postpartum course.  She is ambulating, tolerating a regular diet, passing flatus, and urinating well. Patient is discharged home in stable condition on 08/26/17.   Physical exam  Vitals:   08/25/17 0031 08/25/17 0500 08/25/17 1754 08/26/17 0537  BP: (!) 108/55 (!) 120/52 (!) 115/52 127/72  Pulse: 93 91 90 78  Resp: 18 18 18 18   Temp: 97.6 F (36.4 C) 97.7 F (36.5 C) 98.5 F (36.9 C) 98.2 F (36.8 C)  TempSrc: Oral  Oral  Oral  SpO2: 98% 98% 98%   Weight:      Height:       General: alert, cooperative and no distress Lochia: appropriate Uterine Fundus: firm Incision: Healing well with no significant drainage, Dressing is clean, dry, and intact DVT Evaluation: No evidence of DVT seen on physical exam. Calf/Ankle edema is present Labs: Lab Results  Component Value Date   WBC 10.4 08/24/2017   HGB 13.4 08/24/2017   HCT 38.7 08/24/2017   MCV 84.1 08/24/2017   PLT 206 08/24/2017   CMP Latest Ref Rng & Units 08/24/2017  Glucose 65 - 99 mg/dL 130(Q)  BUN 6 - 20 mg/dL 5(L)  Creatinine 6.57 - 1.00 mg/dL 8.46  Sodium 962 - 952 mmol/L 135  Potassium 3.5 - 5.1 mmol/L 3.4(L)  Chloride 101 - 111 mmol/L 104  CO2 22 - 32 mmol/L 19(L)  Calcium 8.9 - 10.3 mg/dL 8.4(X)  Total Protein 6.5 - 8.1 g/dL  6.2(L)  Total Bilirubin 0.3 - 1.2 mg/dL 0.5  Alkaline Phos 38 - 126 U/L 184(H)  AST 15 - 41 U/L 34  ALT 14 - 54 U/L 23    Discharge instruction: per After Visit Summary and "Baby and Me Booklet".  After visit meds:  Allergies as of 08/26/2017   No Known Allergies     Medication List    STOP taking these medications   aspirin EC 81 MG tablet     TAKE these medications   docusate sodium 100 MG capsule Commonly known as:  COLACE Take 1 capsule (100 mg total) by mouth 2 (two) times daily as needed.   multivitamin-prenatal 27-0.8 MG Tabs tablet Take 1 tablet by mouth daily at 12 noon.       Diet: routine diet  Activity: Advance as tolerated. Pelvic rest for 6 weeks.   Outpatient follow up:4 weeks  Follow up Appt: Future Appointments  Date Time Provider Department Center  09/22/2017 10:00 AM Reva BoresPratt, Tanya S, MD [facility]   Follow up Visit:No follow-ups on file.  Postpartum contraception: Tubal Ligation  Newborn Data: Live born female  Birth Weight: 5 lb 7.8 oz (2490 g) APGAR: 8, 9  Newborn Delivery   Birth date/time:  08/24/2017 11:09:00 Delivery type:  Vaginal, Spontaneous      Baby Feeding: Breast Disposition:home with mother   08/26/2017 Sharyon CableVeronica C Omega Slager, CNM

## 2017-08-26 NOTE — Discharge Instructions (Signed)
Vaginal Delivery, Care After °Refer to this sheet in the next few weeks. These instructions provide you with information about caring for yourself after vaginal delivery. Your health care provider may also give you more specific instructions. Your treatment has been planned according to current medical practices, but problems sometimes occur. Call your health care provider if you have any problems or questions. °What can I expect after the procedure? °After vaginal delivery, it is common to have: °· Some bleeding from your vagina. °· Soreness in your abdomen, your vagina, and the area of skin between your vaginal opening and your anus (perineum). °· Pelvic cramps. °· Fatigue. ° °Follow these instructions at home: °Medicines °· Take over-the-counter and prescription medicines only as told by your health care provider. °· If you were prescribed an antibiotic medicine, take it as told by your health care provider. Do not stop taking the antibiotic until it is finished. °Driving ° °· Do not drive or operate heavy machinery while taking prescription pain medicine. °· Do not drive for 24 hours if you received a sedative. °Lifestyle °· Do not drink alcohol. This is especially important if you are breastfeeding or taking medicine to relieve pain. °· Do not use tobacco products, including cigarettes, chewing tobacco, or e-cigarettes. If you need help quitting, ask your health care provider. °Eating and drinking °· Drink at least 8 eight-ounce glasses of water every day unless you are told not to by your health care provider. If you choose to breastfeed your baby, you may need to drink more water than this. °· Eat high-fiber foods every day. These foods may help prevent or relieve constipation. High-fiber foods include: °? Whole grain cereals and breads. °? Brown rice. °? Beans. °? Fresh fruits and vegetables. °Activity °· Return to your normal activities as told by your health care provider. Ask your health care provider  what activities are safe for you. °· Rest as much as possible. Try to rest or take a nap when your baby is sleeping. °· Do not lift anything that is heavier than your baby or 10 lb (4.5 kg) until your health care provider says that it is safe. °· Talk with your health care provider about when you can engage in sexual activity. This may depend on your: °? Risk of infection. °? Rate of healing. °? Comfort and desire to engage in sexual activity. °Vaginal Care °· If you have an episiotomy or a vaginal tear, check the area every day for signs of infection. Check for: °? More redness, swelling, or pain. °? More fluid or blood. °? Warmth. °? Pus or a bad smell. °· Do not use tampons or douches until your health care provider says this is safe. °· Watch for any blood clots that may pass from your vagina. These may look like clumps of dark red, brown, or black discharge. °General instructions °· Keep your perineum clean and dry as told by your health care provider. °· Wear loose, comfortable clothing. °· Wipe from front to back when you use the toilet. °· Ask your health care provider if you can shower or take a bath. If you had an episiotomy or a perineal tear during labor and delivery, your health care provider may tell you not to take baths for a certain length of time. °· Wear a bra that supports your breasts and fits you well. °· If possible, have someone help you with household activities and help care for your baby for at least a few days after   you leave the hospital. °· Keep all follow-up visits for you and your baby as told by your health care provider. This is important. °Contact a health care provider if: °· You have: °? Vaginal discharge that has a bad smell. °? Difficulty urinating. °? Pain when urinating. °? A sudden increase or decrease in the frequency of your bowel movements. °? More redness, swelling, or pain around your episiotomy or vaginal tear. °? More fluid or blood coming from your episiotomy or  vaginal tear. °? Pus or a bad smell coming from your episiotomy or vaginal tear. °? A fever. °? A rash. °? Little or no interest in activities you used to enjoy. °? Questions about caring for yourself or your baby. °· Your episiotomy or vaginal tear feels warm to the touch. °· Your episiotomy or vaginal tear is separating or does not appear to be healing. °· Your breasts are painful, hard, or turn red. °· You feel unusually sad or worried. °· You feel nauseous or you vomit. °· You pass large blood clots from your vagina. If you pass a blood clot from your vagina, save it to show to your health care provider. Do not flush blood clots down the toilet without having your health care provider look at them. °· You urinate more than usual. °· You are dizzy or light-headed. °· You have not breastfed at all and you have not had a menstrual period for 12 weeks after delivery. °· You have stopped breastfeeding and you have not had a menstrual period for 12 weeks after you stopped breastfeeding. °Get help right away if: °· You have: °? Pain that does not go away or does not get better with medicine. °? Chest pain. °? Difficulty breathing. °? Blurred vision or spots in your vision. °? Thoughts about hurting yourself or your baby. °· You develop pain in your abdomen or in one of your legs. °· You develop a severe headache. °· You faint. °· You bleed from your vagina so much that you fill two sanitary pads in one hour. °This information is not intended to replace advice given to you by your health care provider. Make sure you discuss any questions you have with your health care provider. °Document Released: 05/16/2000 Document Revised: 10/31/2015 Document Reviewed: 06/03/2015 °Elsevier Interactive Patient Education © 2018 [employer] ° °

## 2017-08-26 NOTE — Lactation Note (Signed)
This note was copied from a baby's chart. Lactation Consultation Note  Patient Name: Ashley Wilkerson ZOXWR'UToday's Date: 08/26/2017 Reason for consult: Follow-up assessment;Infant < 6lbs;Late-preterm 34-36.6wks;Difficult latch  Previous LC consult - Preceptor Idamae LusherMargaret Annalysia Willenbring, RN3, IBCLC, checked baby's oral cavity with a gloved  Fingers- LC noted the upper lip to be slightly tight , but stretches with exam and with assist when latching.  Prior to feeding, baby only stretched tongue slightly over gum line, and after feeding tongue stretch increased.  Baby able to raise tongue upward short distance. High palate noted, and small mouth.  LC highly recommended to mom shells between feeding except when sleeping.  Beth DelFava, RN, IBCLC placed a request [facility] mom to scheduled  LC O/P appt. Next week for feeding reassessment.    Maternal Data Has patient been taught Hand Expression?: Yes Does the patient have breastfeeding experience prior to this delivery?: Yes  Feeding Interventions Consult Status Consult Status: Complete Date: 08/26/17 Follow-up type: In-patient    Matilde SprangMargaret Ann Deaglan Lile 08/26/2017, 11:29 AM

## 2017-08-27 ENCOUNTER — Ambulatory Visit: Payer: Self-pay

## 2017-08-27 NOTE — Lactation Note (Signed)
This note was copied from a baby's chart. Lactation Consultation Note  Patient Name: Ashley Wilkerson WUJWJ'XToday's Date: 08/27/2017 Reason for consult: Follow-up assessment;Late-preterm 34-36.6wks;Infant < 6lbs  Infant now 70 hours of age and anticipating discharge today.  Infant ready for a feed and mother is feeding with Sim 22.  Reviewed paced bottle feeding with mother and burping.  Infant observed feeding and took 23 mls without difficulty.  OP referral entered for a visit in approximately 1 week from today with mother's permission. Encouraged STS, feeding cues, awakening infant every 3 hours, and to burp frequently.Mom encouraged to feed baby 8-12 times/24 hours and with feeding cues. Mom made aware of O/P services, breastfeeding support groups, community resources, and our phone # for post-discharge questions. Mother has no further questions/concerns at this time.             Interventions Interventions: Skin to skin;Breast feeding basics reviewed;Breast massage;Hand express;Shells;DEBP  Lactation Tools Discussed/Used WIC Program: Yes   Consult Status Consult Status: Complete Date: 08/27/17 Follow-up type: Out-patient(referral made)    Irene PapBeth R Mayukha Symmonds 08/27/2017, 9:35 AM

## 2017-08-28 LAB — TYPE AND SCREEN
ABO/RH(D): O NEG
Antibody Screen: POSITIVE
Unit division: 0
Unit division: 0

## 2017-08-28 LAB — BPAM RBC
Blood Product Expiration Date: 201904022359
Blood Product Expiration Date: 201904042359
Unit Type and Rh: 9500
Unit Type and Rh: 9500

## 2017-09-01 ENCOUNTER — Encounter: Payer: Medicaid Other | Admitting: Obstetrics and Gynecology

## 2017-09-08 ENCOUNTER — Encounter: Payer: Medicaid Other | Admitting: Family Medicine

## 2017-09-15 ENCOUNTER — Encounter: Payer: Medicaid Other | Admitting: Obstetrics & Gynecology

## 2017-09-22 ENCOUNTER — Ambulatory Visit (INDEPENDENT_AMBULATORY_CARE_PROVIDER_SITE_OTHER): Payer: Medicaid Other | Admitting: Family Medicine

## 2017-09-22 ENCOUNTER — Encounter: Payer: Self-pay | Admitting: Family Medicine

## 2017-09-22 DIAGNOSIS — Z1389 Encounter for screening for other disorder: Secondary | ICD-10-CM

## 2017-09-22 NOTE — Progress Notes (Signed)
..  Post Partum Exam  Ashley Wilkerson is a 37 y.o. 726 709 [id number] female who presents for a postpartum visit. She is 4 weeks postpartum following a spontaneous vaginal delivery. I have fully reviewed the prenatal and intrapartum course. The delivery was at 36 gestational weeks.  Anesthesia: Epidural . Postpartum course has been good Baby's course has been good. Baby is feeding by both breast and bottle - Similac Neosure. Bleeding no bleeding. Bowel function is normal. Bladder function is normal. Patient is not sexually active. Contraception method is tubal ligation. Postpartum depression screening:neg I have reviewed the above information with patient and confirmed it is correct. The following portions of the patient's history were reviewed and updated as appropriate: allergies, current medications, past family history, past medical history, past social history, past surgical history and problem list. Last pap smear done 01/2017 and was Normal  Review of Systems Pertinent items noted in HPI and remainder of comprehensive ROS otherwise negative.    Objective:  Blood pressure 109/82, pulse 78, temperature 98.3 F (36.8 C), temperature source Oral, resp. rate 16, weight 198 lb 9.6 oz (90.1 kg), currently breastfeeding.  General:  alert, cooperative and appears stated age  Lungs: normal effort  Heart:  regular rate and rhythm  Abdomen: soft, non-tender        Assessment:    Normal postpartum exam. Pap smear not done at today's visit.   Plan:   1. Contraception: tubal ligation 2. Pap due 2021 3. Follow up in: 1 year or as needed.

## 2017-10-25 ENCOUNTER — Encounter (HOSPITAL_BASED_OUTPATIENT_CLINIC_OR_DEPARTMENT_OTHER): Payer: Self-pay | Admitting: Emergency Medicine

## 2017-10-25 ENCOUNTER — Emergency Department (HOSPITAL_BASED_OUTPATIENT_CLINIC_OR_DEPARTMENT_OTHER)
Admission: EM | Admit: 2017-10-25 | Discharge: 2017-10-25 | Disposition: A | Discharge status: Home / Self Care | Payer: Medicaid Other | Attending: Emergency Medicine | Admitting: Emergency Medicine

## 2017-10-25 ENCOUNTER — Other Ambulatory Visit: Payer: Self-pay

## 2017-10-25 DIAGNOSIS — J029 Acute pharyngitis, unspecified: Secondary | ICD-10-CM | POA: Insufficient documentation

## 2017-10-25 LAB — RAPID STREP SCREEN (MED CTR MEBANE ONLY): Streptococcus, Group A Screen (Direct): NEGATIVE

## 2017-10-25 MED ORDER — DEXAMETHASONE 6 MG PO TABS
10.0000 mg | ORAL_TABLET | Freq: Once | ORAL | Status: AC
  Administered 2017-10-25: 18:00:00 10 mg via ORAL
  Filled 2017-10-25: qty 1

## 2017-10-25 MED ORDER — CETIRIZINE HCL 10 MG PO TABS: 10.0000 mg | ORAL_TABLET | Freq: Every day | ORAL | 0 refills | Status: DC

## 2017-10-25 MED ORDER — IBUPROFEN 600 MG PO TABS: 600.0000 mg | ORAL_TABLET | Freq: Four times a day (QID) | ORAL | 0 refills | Status: DC | PRN

## 2017-10-25 MED ORDER — ACETAMINOPHEN 500 MG PO TABS: 500.0000 mg | ORAL_TABLET | Freq: Four times a day (QID) | ORAL | 0 refills | Status: DC | PRN

## 2017-10-25 NOTE — ED Triage Notes (Signed)
Patient states that she has had sore throat x 7 days - denies any SOB or trouble swallowing

## 2017-10-25 NOTE — Discharge Instructions (Signed)
Medications: Zyrtec, ibuprofen, Tylenol  Treatment: Take Zyrtec once daily as prescribed.  You can alternate ibuprofen and Tylenol, as needed for your pain.  Make sure to drink plenty of fluids.  Follow-up: Please follow-up with your doctor if symptoms are not improving over the next 5 to 7 days.  Please return to the emergency department if you develop any new or worsening symptoms including asymmetry in your throat, drooling, inability to swallow, or any other new or concerning symptom.

## 2017-10-25 NOTE — ED Provider Notes (Signed)
[facility] EMERGENCY DEPARTMENT Provider Note   CSN: [id number] Arrival date & time: 10/25/17  1627     History   Chief Complaint Chief Complaint  Patient presents with  . Sore Throat    HPI Ashley Wilkerson is a 37 y.o. female with history of obesity, sickle cell trait who presents with a one-week history of sore throat.  Patient is able to eat and drink and has no trouble swallowing.  She has no drooling.  She has had minimal nasal congestion and mild cough.  She denies fevers.  She denies any abdominal pain, nausea, vomiting, chest pain, shortness of breath.  She has taken Flonase and over-the-counter throat lozenges and NyQuil without significant relief.  She does have history of seasonal allergies.  HPI  Past Medical History:  Diagnosis Date  . Abnormal Pap smear   . Prior pregnancy with fetal demise 04/2016   [redacted] weeks gestation   . SVD (spontaneous vaginal delivery)    x 3 - 2 living, 1 fetal demise at [redacted] wks gestation  . Vaginal Pap smear, abnormal     Patient Active Problem List   Diagnosis Date Noted  . Sickle cell trait (HCC) 02/13/2017  . Obesity (BMI 30-39.9) 02/10/2017    Past Surgical History:  Procedure Laterality Date  . CERVICAL CERCLAGE  04/2016   [redacted] wks gestation - fetal demise  . CERVICAL CERCLAGE N/A 03/26/2017   Procedure: CERCLAGE CERVICAL;  Surgeon: Tereso Newcomer, MD;  Location: [facility];  Service: Gynecology;  Laterality: N/A;  . CRYOTHERAPY    . TUBAL LIGATION Bilateral 08/24/2017   Procedure: POST PARTUM TUBAL LIGATION;  Surgeon: Hermina Staggers, MD;  Location: [facility] BIRTHING SUITES;  Service: Gynecology;  Laterality: Bilateral;  . WISDOM TOOTH EXTRACTION       OB History    Gravida  4   Para  4   Term  2   Preterm  1   AB      Living  3     SAB      TAB      Ectopic      Multiple  0   Live Births  3            Home Medications    Prior to Admission medications   Medication Sig Start  Date End Date Taking? Authorizing Provider  acetaminophen (TYLENOL) 500 MG tablet Take 1 tablet (500 mg total) by mouth every 6 (six) hours as needed. 10/25/17   Charina Fons, Waylan Boga, PA-C  cetirizine (ZYRTEC ALLERGY) 10 MG tablet Take 1 tablet (10 mg total) by mouth daily. 10/25/17   Akiko Schexnider, Waylan Boga, PA-C  ibuprofen (ADVIL,MOTRIN) 600 MG tablet Take 1 tablet (600 mg total) by mouth every 6 (six) hours as needed. 10/25/17   Dora Clauss, Waylan Boga, PA-C  Prenatal Vit-Fe Fumarate-FA (MULTIVITAMIN-PRENATAL) 27-0.8 MG TABS tablet Take 1 tablet by mouth daily at 12 noon.    [provider]    Family History Family History  Problem Relation Age of Onset  . Hyperlipidemia Mother   . Hypertension Mother   . Cancer Neg Hx   . Birth defects Neg Hx     Social History Social History   Tobacco Use  . Smoking status: Never Smoker  . Smokeless tobacco: Never Used  Substance Use Topics  . Alcohol use: No  . Drug use: No     Allergies   Patient has no known allergies.   Review of Systems Review  of Systems  Constitutional: Negative for fever.  HENT: Positive for congestion and sore throat.   Respiratory: Negative for cough and shortness of breath.   Cardiovascular: Negative for chest pain.  Gastrointestinal: Negative for abdominal pain, nausea and vomiting.     Physical Exam Updated Vital Signs BP 127/87 (BP Location: Left Arm)   Pulse 91   Temp 98.7 F (37.1 C) (Oral)   Resp 18   Ht 5' (1.524 m)   Wt 88.5 kg (195 lb)   LMP 10/11/2017   SpO2 98%   BMI 38.08 kg/m   Physical Exam  Constitutional: She appears well-developed and well-nourished. No distress.  HENT:  Head: Normocephalic and atraumatic.  Right Ear: Tympanic membrane normal.  Left Ear: Tympanic membrane normal.  Mouth/Throat: Posterior oropharyngeal edema and posterior oropharyngeal erythema (mild) present. No oropharyngeal exudate or tonsillar abscesses. Tonsils are 3+ on the right. Tonsils are 3+ on the left.    Eyes: Pupils are equal, round, and reactive to light. Conjunctivae are normal. Right eye exhibits no discharge. Left eye exhibits no discharge. No scleral icterus.  Neck: Normal range of motion. Neck supple. No thyromegaly present.  Cardiovascular: Normal rate, regular rhythm, normal heart sounds and intact distal pulses. Exam reveals no gallop and no friction rub.  No murmur heard. Pulmonary/Chest: Effort normal and breath sounds normal. No stridor. No respiratory distress. She has no wheezes. She has no rales.  Musculoskeletal: She exhibits no edema.  Lymphadenopathy:    She has no cervical adenopathy.  Neurological: She is alert. Coordination normal.  Skin: Skin is warm and dry. No rash noted. She is not diaphoretic. No pallor.  Psychiatric: She has a normal mood and affect.  Nursing note and vitals reviewed.    ED Treatments / Results  Labs (all labs ordered are listed, but only abnormal results are displayed) Labs Reviewed  RAPID STREP SCREEN ([facility] ONLY)  CULTURE, GROUP A STREP [facility])    EKG None  Radiology No results found.  Procedures Procedures (including critical care time)  Medications Ordered in ED Medications  dexamethasone (DECADRON) tablet 10 mg (10 mg Oral Given 10/25/17 1806)     Initial Impression / Assessment and Plan / ED Course  I have reviewed the triage vital signs and the nursing notes.  Pertinent labs & imaging results that were available during my care of the patient were reviewed by me and considered in my medical decision making (see chart for details).     Patient with sore throat and nasal congestion.  Rapid strep is negative.  Culture sent.  Suspect viral versus environmental allergy related.  Will start Zyrtec.  Patient given single dose Decadron in the ED.  Supportive treatment discussed including ibuprofen and Tylenol.  No signs of peritonsillar abscess.  Follow-up to PCP if symptoms are not improving.  Return precautions discussed.   Patient understands and agrees with plan.  Patient vitals stable throughout ED course and discharged in satisfactory condition.  Final Clinical Impressions(s) / ED Diagnoses   Final diagnoses:  Sore throat    ED Discharge Orders        Ordered    cetirizine (ZYRTEC ALLERGY) 10 MG tablet  Daily     10/25/17 1801    ibuprofen (ADVIL,MOTRIN) 600 MG tablet  Every 6 hours PRN     10/25/17 1801    acetaminophen (TYLENOL) 500 MG tablet  Every 6 hours PRN     10/25/17 1801       Aubry Tucholski, [employer]  Blair Heys 10/25/17 1610    Arby Barrette, MD 11/02/17 [id number]

## 2017-10-25 NOTE — ED Notes (Signed)
Given  popsicle

## 2017-10-28 LAB — CULTURE, GROUP A STREP (THRC)

## 2017-11-02 ENCOUNTER — Emergency Department
Admission: EM | Admit: 2017-11-02 | Discharge: 2017-11-02 | Disposition: A | Discharge status: Home / Self Care | Payer: Medicaid Other | Attending: Emergency Medicine | Admitting: Emergency Medicine

## 2017-11-02 ENCOUNTER — Other Ambulatory Visit: Payer: Self-pay

## 2017-11-02 ENCOUNTER — Encounter: Payer: Self-pay | Admitting: Emergency Medicine

## 2017-11-02 DIAGNOSIS — Z79899 Other long term (current) drug therapy: Secondary | ICD-10-CM | POA: Insufficient documentation

## 2017-11-02 DIAGNOSIS — J029 Acute pharyngitis, unspecified: Secondary | ICD-10-CM

## 2017-11-02 DIAGNOSIS — R070 Pain in throat: Secondary | ICD-10-CM | POA: Diagnosis present

## 2017-11-02 LAB — GROUP A STREP BY PCR: Group A Strep by PCR: NOT DETECTED

## 2017-11-02 LAB — MONONUCLEOSIS SCREEN: MONO SCREEN: NEGATIVE

## 2017-11-02 MED ORDER — MAGIC MOUTHWASH W/LIDOCAINE: 5.0000 mL | Freq: Four times a day (QID) | ORAL | 0 refills | Status: DC

## 2017-11-02 NOTE — ED Triage Notes (Signed)
Pt arrived to the ED for complaints of sore throat for the last 2 weeks. Pt states that she was seem for the same 1 week ago and her strep test was negative. Pt is AOx4 in no apparent distress, no cough or nasal congestion noticed.

## 2017-11-02 NOTE — ED Provider Notes (Signed)
[facility] Emergency Department Provider Note  ____________________________________________  Time seen: Approximately 9:09 PM  I have reviewed the triage vital signs and the nursing notes.   HISTORY  Chief Complaint Sore Throat    HPI Ashley Wilkerson is a 37 y.o. female who presents the emergency department with ongoing sore throat.  Patient reports that she has had bilateral sore throat x2 weeks.  A week ago, she was evaluated by urgent care with a negative strep test.  Patient continues to have sore throat.  She denies any ear pain, nasal congestion, coughing, abdominal pain, nausea or vomiting.  Patient denies any fevers or chills with this complaint.  She reports that it is a scratchy/sharp sensation.  No difficulty breathing or swallowing.  Patient is tried over-the-counter medications with no relief.  Patient does endorse increased fatigue over the past 2 weeks.    Past Medical History:  Diagnosis Date  . Abnormal Pap smear   . Prior pregnancy with fetal demise 04/2016   [redacted] weeks gestation   . SVD (spontaneous vaginal delivery)    x 3 - 2 living, 1 fetal demise at [redacted] wks gestation  . Vaginal Pap smear, abnormal     Patient Active Problem List   Diagnosis Date Noted  . Sickle cell trait (HCC) 02/13/2017  . Obesity (BMI 30-39.9) 02/10/2017    Past Surgical History:  Procedure Laterality Date  . CERVICAL CERCLAGE  04/2016   [redacted] wks gestation - fetal demise  . CERVICAL CERCLAGE N/A 03/26/2017   Procedure: CERCLAGE CERVICAL;  Surgeon: Tereso Newcomer, MD;  Location: [facility] ORS;  Service: Gynecology;  Laterality: N/A;  . CRYOTHERAPY    . TUBAL LIGATION Bilateral 08/24/2017   Procedure: POST PARTUM TUBAL LIGATION;  Surgeon: Hermina Staggers, MD;  Location: [facility] BIRTHING SUITES;  Service: Gynecology;  Laterality: Bilateral;  . WISDOM TOOTH EXTRACTION      Prior to Admission medications   Medication Sig Start Date End Date Taking?  Authorizing Provider  acetaminophen (TYLENOL) 500 MG tablet Take 1 tablet (500 mg total) by mouth every 6 (six) hours as needed. 10/25/17   Law, Waylan Boga, PA-C  cetirizine (ZYRTEC ALLERGY) 10 MG tablet Take 1 tablet (10 mg total) by mouth daily. 10/25/17   Law, Waylan Boga, PA-C  ibuprofen (ADVIL,MOTRIN) 600 MG tablet Take 1 tablet (600 mg total) by mouth every 6 (six) hours as needed. 10/25/17   Emi Holes, PA-C  magic mouthwash w/lidocaine SOLN Take 5 mLs by mouth 4 (four) times daily. 11/02/17   Darnice Comrie, Delorise Royals, PA-C  Prenatal Vit-Fe Fumarate-FA (MULTIVITAMIN-PRENATAL) 27-0.8 MG TABS tablet Take 1 tablet by mouth daily at 12 noon.    [provider]    Allergies Patient has no known allergies.  Family History  Problem Relation Age of Onset  . Hyperlipidemia Mother   . Hypertension Mother   . Cancer Neg Hx   . Birth defects Neg Hx     Social History Social History   Tobacco Use  . Smoking status: Never Smoker  . Smokeless tobacco: Never Used  Substance Use Topics  . Alcohol use: No  . Drug use: No     Review of Systems  Constitutional: No fever/chills Eyes: No visual changes. No discharge ENT: N positive for 2 weeks of sore throat Cardiovascular: no chest pain. Respiratory: no cough. No SOB. Gastrointestinal: No abdominal pain.  No nausea, no vomiting.  No diarrhea.  No constipation. Genitourinary: Negative for dysuria. No hematuria  Musculoskeletal: Negative for musculoskeletal pain. Skin: Negative for rash, abrasions, lacerations, ecchymosis. Neurological: Negative for headaches, focal weakness or numbness. 10-point ROS otherwise negative.  ____________________________________________   PHYSICAL EXAM:  VITAL SIGNS: ED Triage Vitals  Enc Vitals Group     BP 11/02/17 2051 (!) 143/89     Pulse Rate 11/02/17 2051 86     Resp 11/02/17 2051 18     Temp 11/02/17 2051 98.8 F (37.1 C)     Temp Source 11/02/17 2051 Oral     SpO2 11/02/17 2051 100  %     Weight 11/02/17 2052 196 lb (88.9 kg)     Height 11/02/17 2052 5' (1.524 m)     Head Circumference --      Peak Flow --      Pain Score 11/02/17 2052 3     Pain Loc --      Pain Edu? --      Excl. in GC? --      Constitutional: Alert and oriented. Well appearing and in no acute distress. Eyes: Conjunctivae are normal. PERRL. EOMI. Head: Atraumatic. ENT:      Ears: EACs and TMs unremarkable bilaterally.      Nose: Mild congestion/rhinnorhea.  Only boggy.      Mouth/Throat: Mucous membranes are moist.  Pharynx is mildly erythematous and mildly edematous.  Tonsils are mildly erythematous and edematous.  No exudates.  Uvula is midline. Neck: No stridor.  Neck is supple full range of motion Hematological/Lymphatic/Immunilogical: No cervical lymphadenopathy. Cardiovascular: Normal rate, regular rhythm. Normal S1 and S2.  Good peripheral circulation. Respiratory: Normal respiratory effort without tachypnea or retractions. Lungs CTAB. Good air entry to the bases with no decreased or absent breath sounds. Musculoskeletal: Full range of motion to all extremities. No gross deformities appreciated. Neurologic:  Normal speech and language. No gross focal neurologic deficits are appreciated.  Skin:  Skin is warm, dry and intact. No rash noted. Psychiatric: Mood and affect are normal. Speech and behavior are normal. Patient exhibits appropriate insight and judgement.   ____________________________________________   LABS (all labs ordered are listed, but only abnormal results are displayed)  Labs Reviewed  GROUP A STREP BY PCR  MONONUCLEOSIS SCREEN   ____________________________________________  EKG   ____________________________________________  RADIOLOGY   No results found.  ____________________________________________    PROCEDURES  Procedure(s) performed:    Procedures    Medications - No data to  display   ____________________________________________   INITIAL IMPRESSION / ASSESSMENT AND PLAN / ED COURSE  Pertinent labs & imaging results that were available during my care of the patient were reviewed by me and considered in my medical decision making (see chart for details).  Review of the Industry CSRS was performed in accordance of the [facility] prior to dispensing any controlled drugs.     Patient's diagnosis is consistent with sore throat.  Patient presents the emergency department with 2-week complaint of sore throat.  Patient was Artie evaluated at urgent care without a definitive diagnosis.  Patient presents emergency department for ongoing sore throat.  No fever chills, other URI symptoms.  On exam, exam is reassuring with no acute findings.  Differential included strep, mono, peritonsillar abscess, retropharyngeal abscess, postnasal drip, sore throat.  Mononucleosis and strep test is negative.  No indication on physical exam of abscess.  At this time, patient will be given symptomatic medications of Magic mouthwash and advised to follow-up with ENT if symptoms persist..  Patient is given ED precautions to return to  the ED for any worsening or new symptoms.     ____________________________________________  FINAL CLINICAL IMPRESSION(S) / ED DIAGNOSES  Final diagnoses:  Sore throat      NEW MEDICATIONS STARTED DURING THIS VISIT:  ED Discharge Orders        Ordered    magic mouthwash w/lidocaine SOLN  4 times daily    Note to Pharmacy:  Dispense in a 1/1/1 ratio. Use lidocaine, diphenhydramine, prednisolone   11/02/17 2237          This chart was dictated using voice recognition software/Dragon. Despite best efforts to proofread, errors can occur which can change the meaning. Any change was purely unintentional.    Racheal Patches, PA-C 11/02/17 2238    Jeanmarie Plant, MD 11/02/17 2251

## 2018-01-01 IMAGING — US US MFM OB LIMITED
2 series · 15 of 19 positions shown · non-contrast
Comparison: none

[Series 1: us mfm ob limited · 15 acquisitions, 12 frames shown (1 of 2)]
[im 1/15]
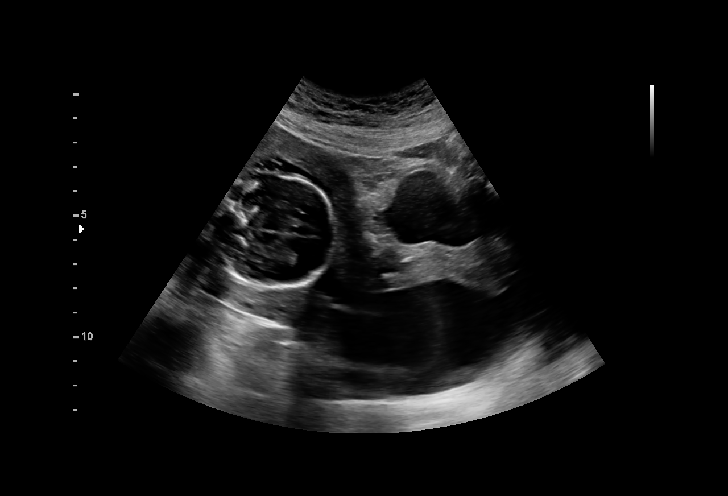
[im 2/15]
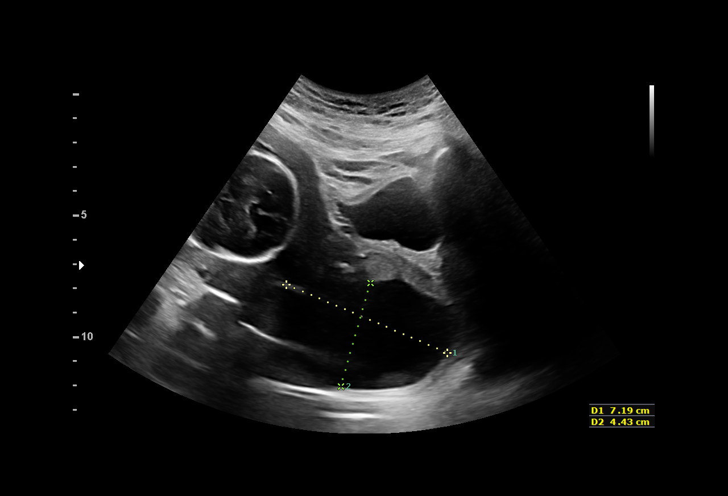
[im 4/15]
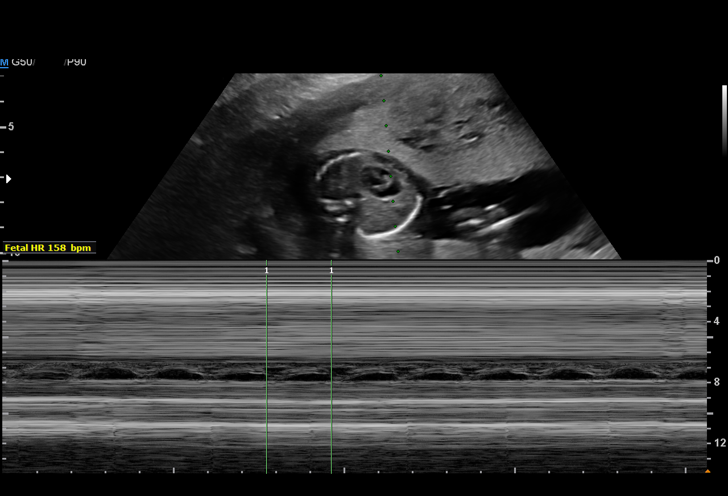
[im 5/15]
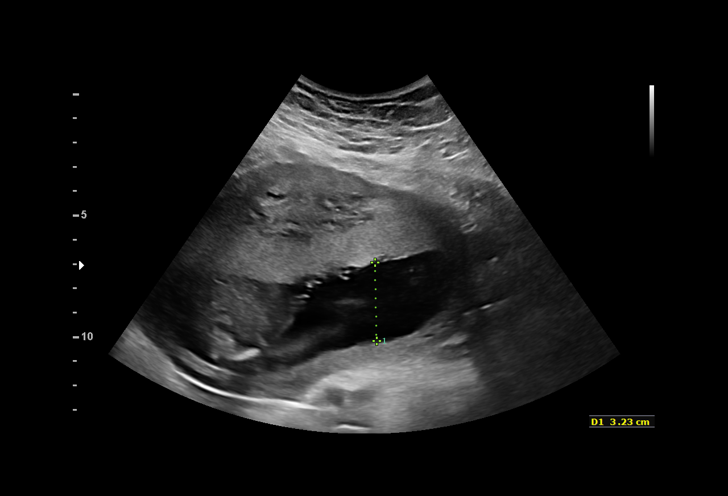
[im 6/15]
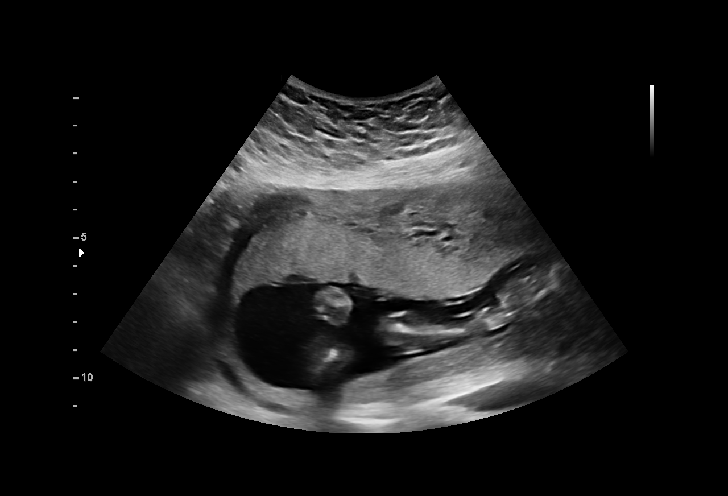
[im 7/15]
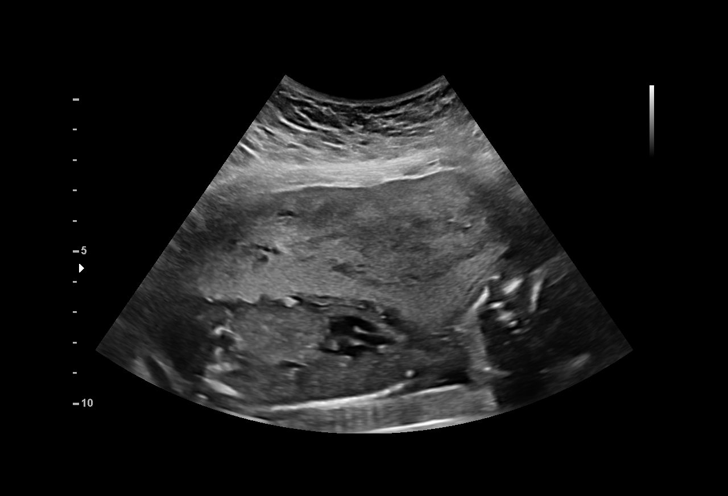
[im 9/15]
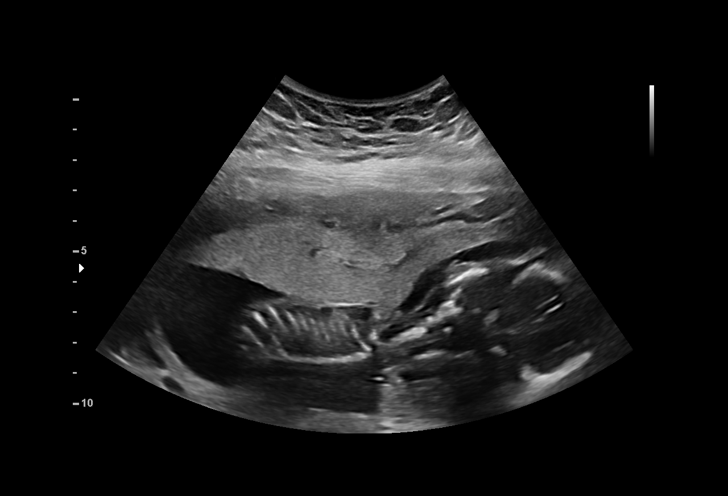
[im 10/15]
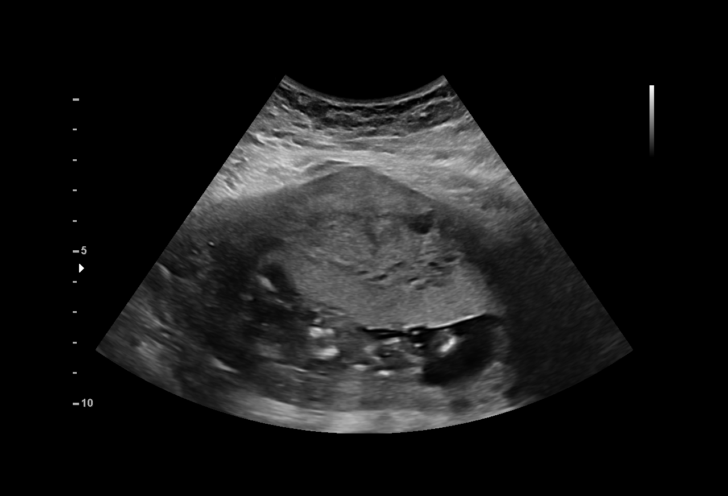
[im 11/15]
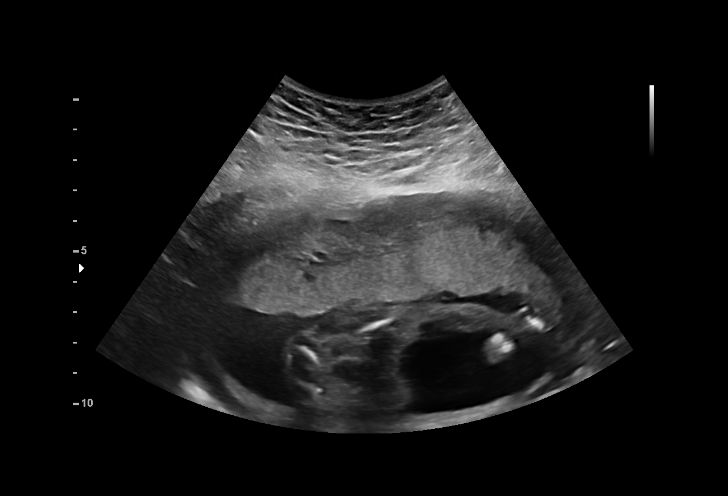
[im 13/15]
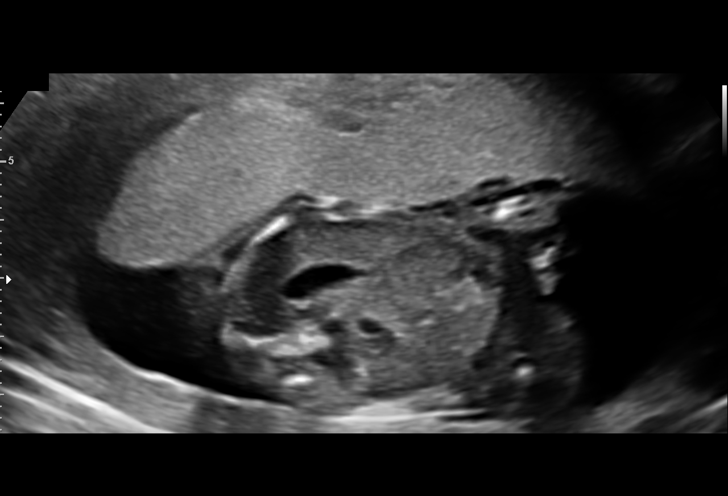
[im 14/15]
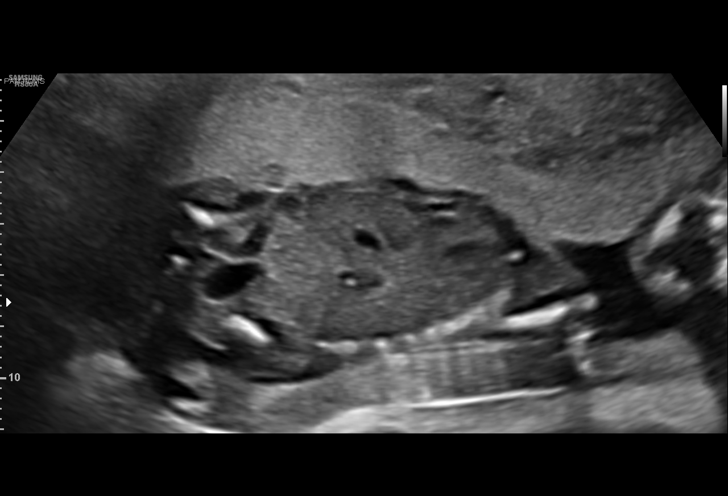
[im 15/15]
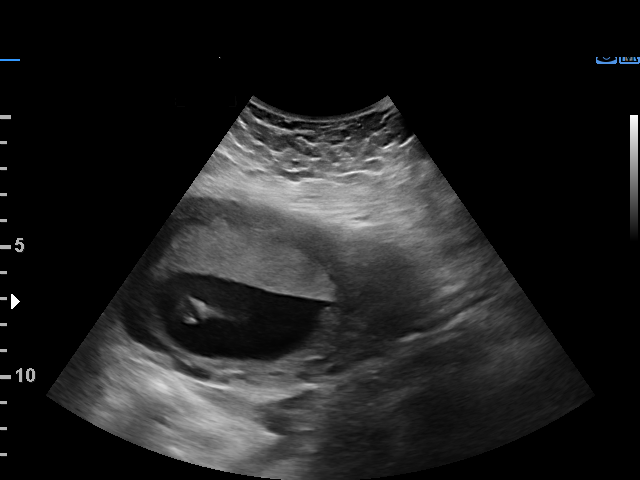

[Series 4: us mfm ob limited · 3 of 4 slices shown (2 of 2)]
[im 1/4]
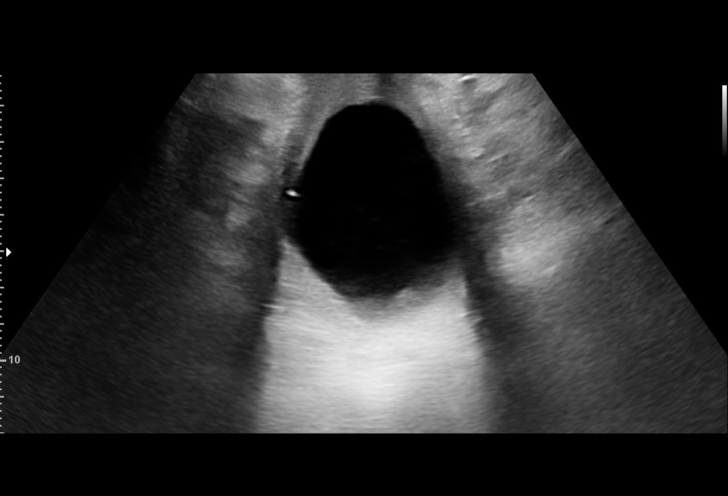
[im 3/4]
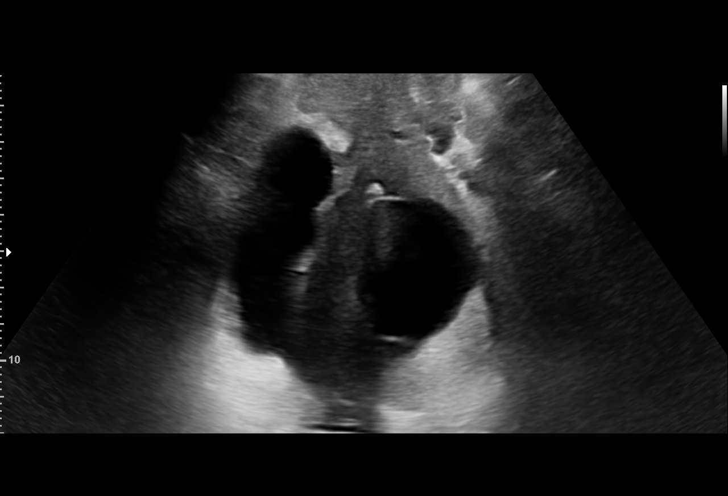
[im 4/4]
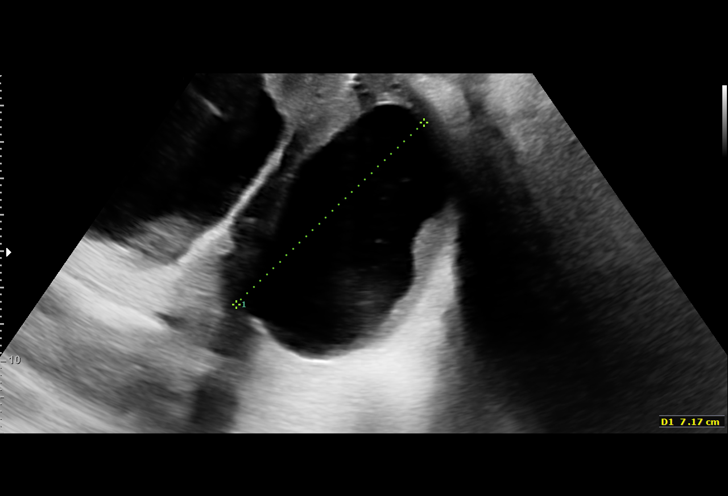

[15 of 19 positions shown; findings below may reference images not displayed]

1  JAWANAN LINET             [id number]      [id number]     [id number]
Indications

20 weeks gestation of pregnancy
Advanced maternal age multigravida 35+,
second trimester
Cervical incompetence, second trimester
OB History

Gravidity:    3         Term:   2
Living:       2
Fetal Evaluation

Num Of Fetuses:     1
Fetal Heart         158
Rate(bpm):
Cardiac Activity:   Observed
Presentation:       Cephalic
Placenta:           Anterior, above cervical os

Amniotic Fluid
AFI FV:      Subjectively within normal limits

Largest Pocket(cm)
3.2
Gestational Age

LMP:           20w 1d        Date:  12/01/15                 EDD:   09/06/16
Best:          20w 1d     Det. By:  LMP  (12/01/15)          EDD:   09/06/16
Cervix Uterus Adnexa

Cervix
Length:              0  cm.
Hourglass membranes into the vagina.
Impression

IUP at 20+1 wwks
Cerclage in situ
Normal fetal cardiac activity and movement
Prolapse of membranes into vagina through intact cerclage
Recommendations

Case discussed with Dr. Mareille by Dr. Sawh at the time of
the scan. Continue inpatient management

## 2018-11-28 IMAGING — US US MFM OB TRANSVAGINAL
1 series · 14 of 28 positions shown · non-contrast
Comparison: none

[Series 1: us mfm ob transvaginal · 55 acquisitions, 14 frames shown]
[im 3/55]
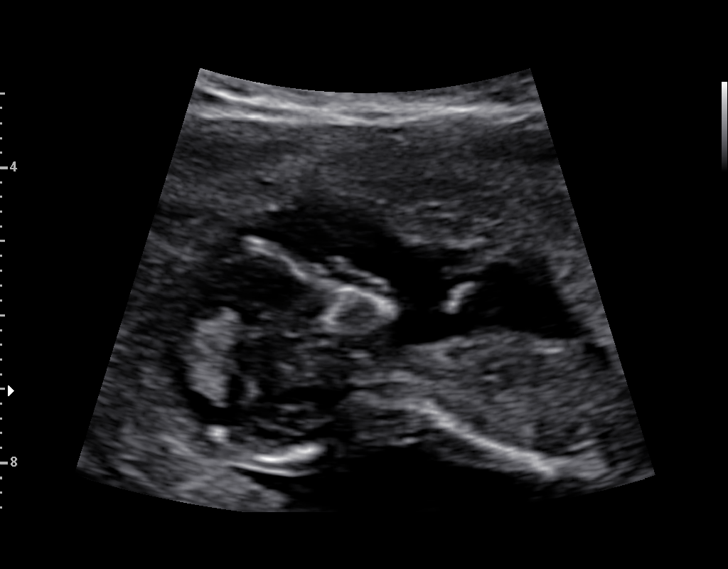
[im 7/55]
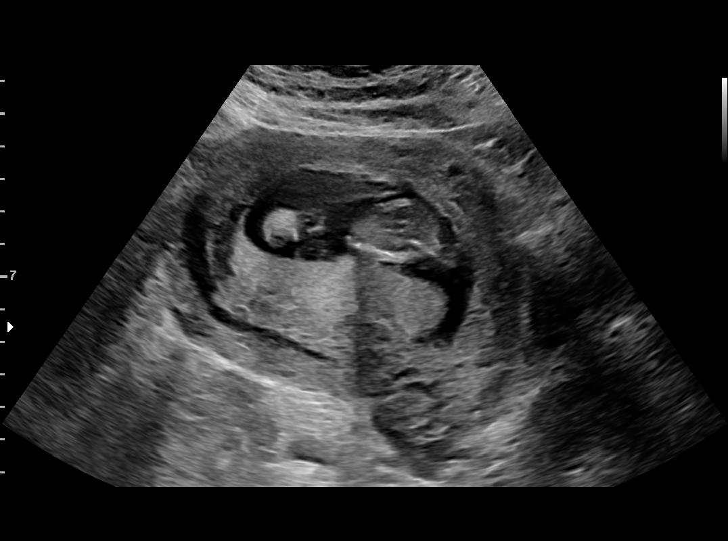
[im 11/55]
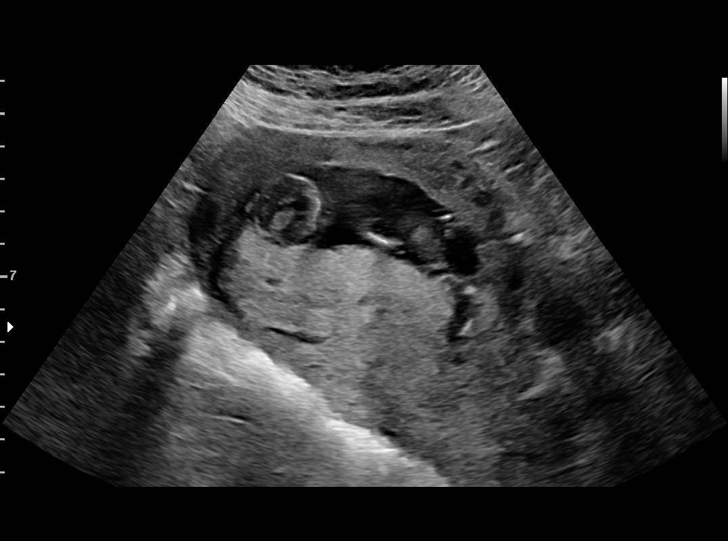
[im 15/55]
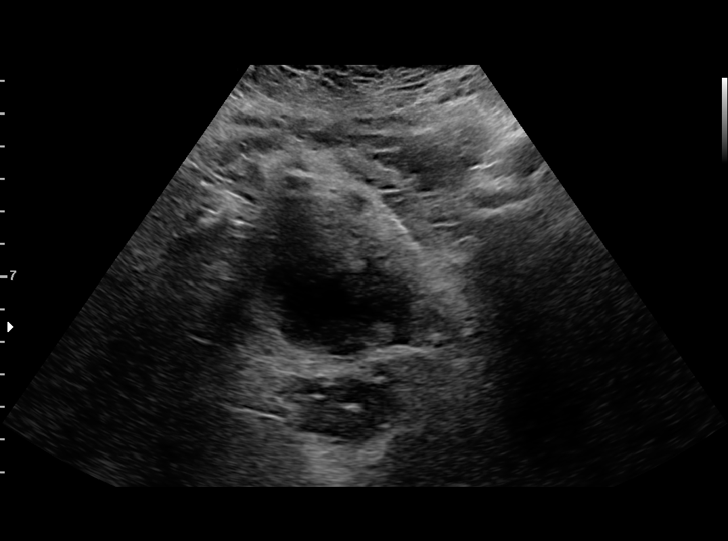
[im 19/55]
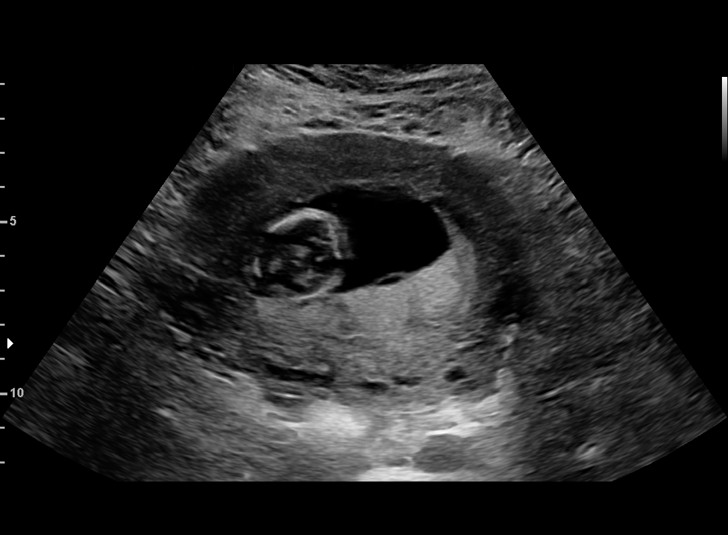
[im 23/55]
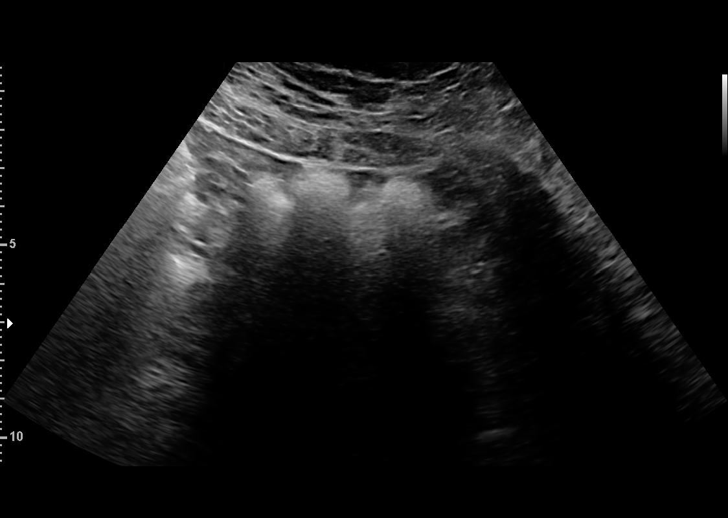
[im 27/55]
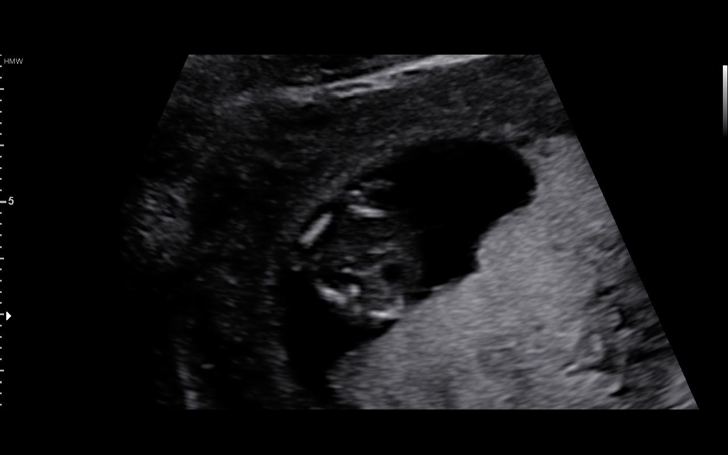
[im 31/55]
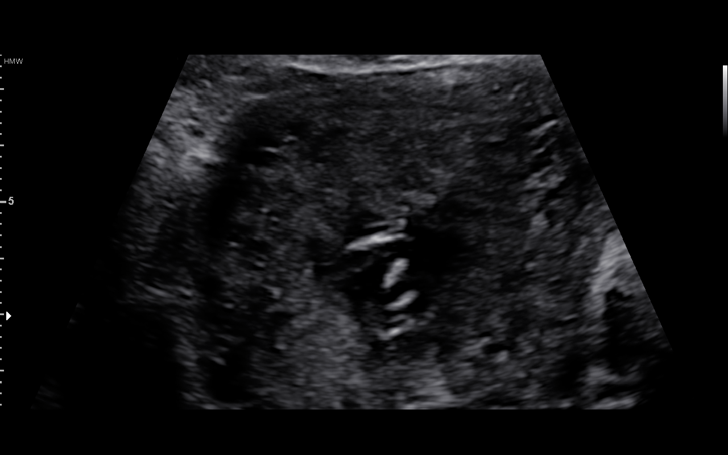
[im 35/55]
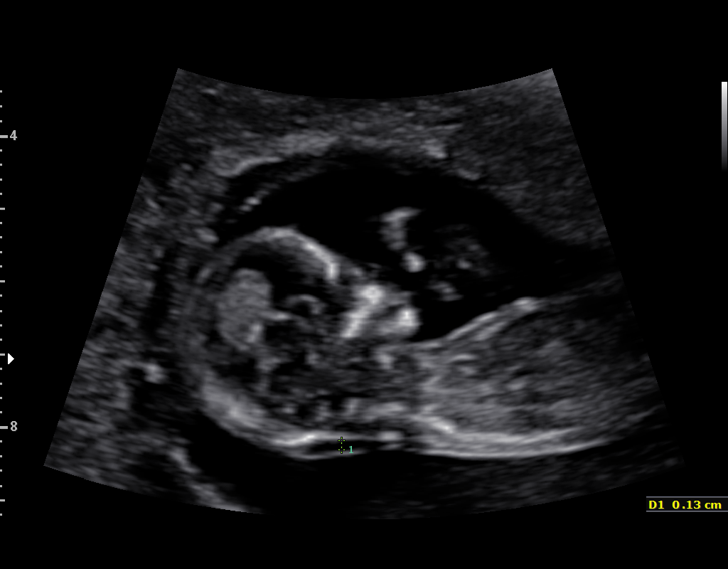
[im 39/55]
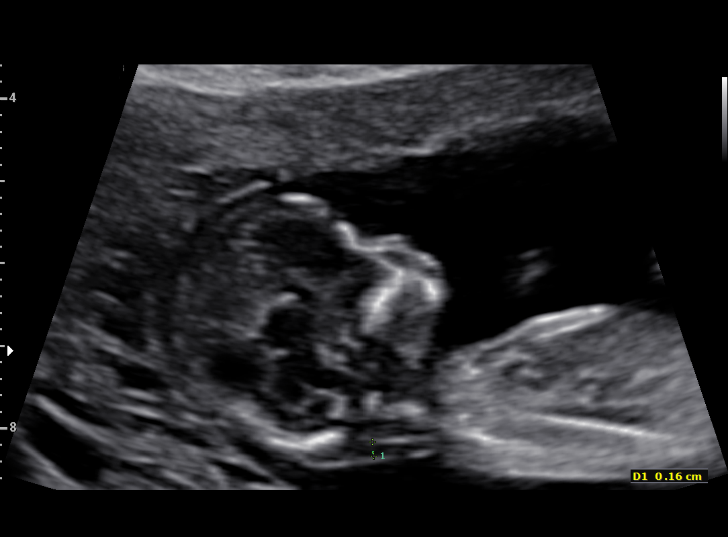
[im 43/55]
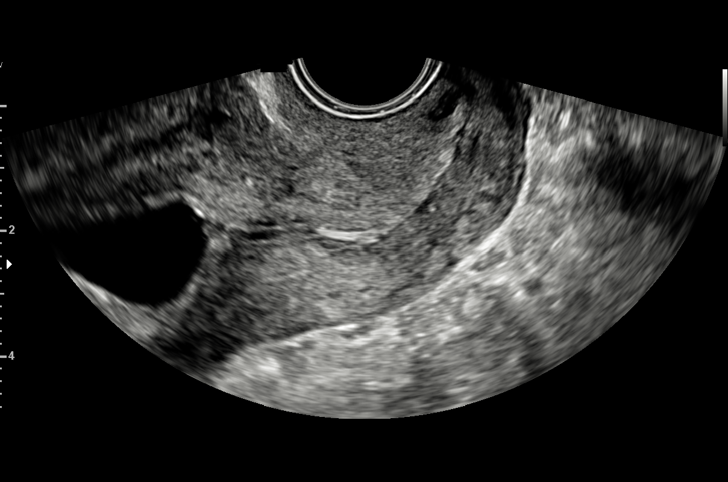
[im 47/55]
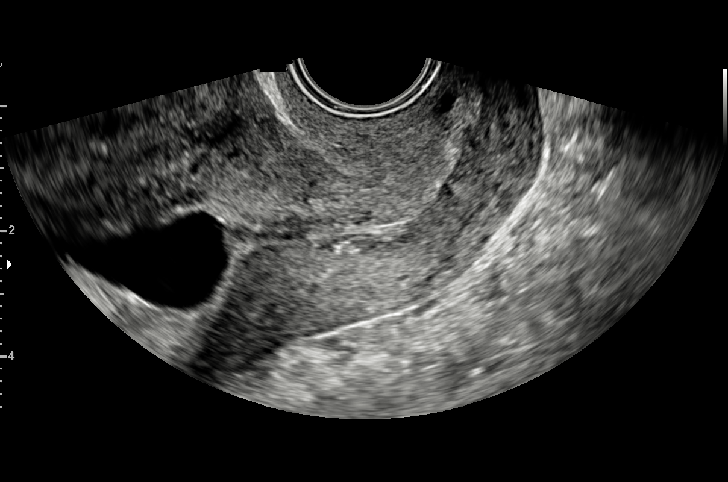
[im 51/55]
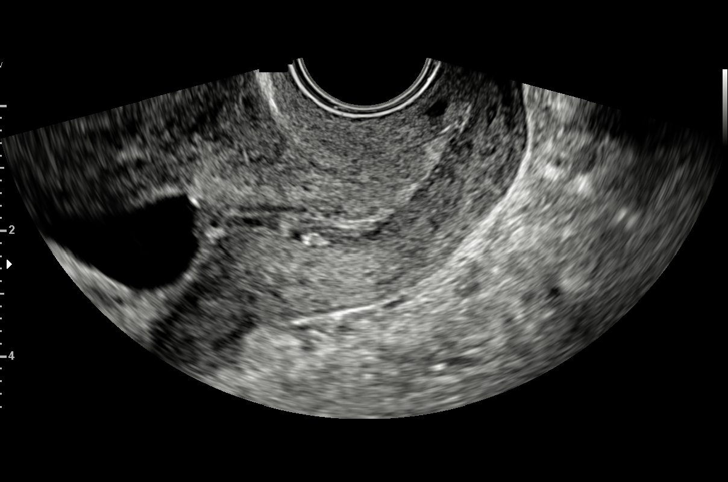
[im 55/55]
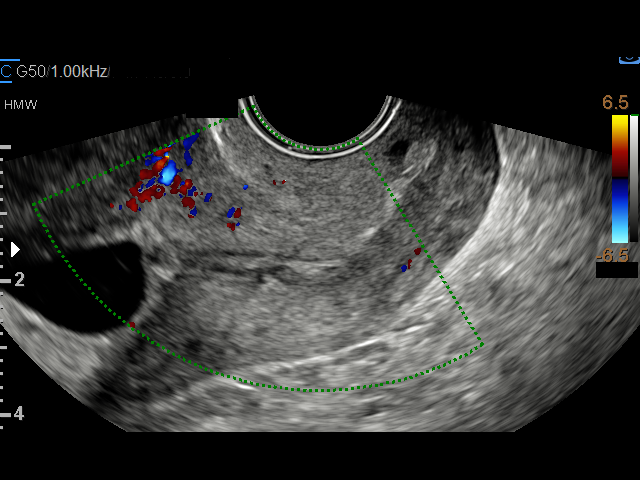

[14 of 28 positions shown; findings below may reference images not displayed]

[REDACTED]

TRANSLUCENCY

1  SAC          [id number]      [id number]     [id number]
2  BENRABAH ETOIL           [id number]      [id number]     [id number]
Indications

13 weeks gestation of pregnancy
Advanced maternal age multigravida 35+,
first trimester
Obesity complicating pregnancy, first
trimester
Encounter for nuchal translucency
Cervical incompetence, first trimester
Short interval between pregancies, 1st
trimester
OB History

Blood Type:            Height:  5'0"   Weight (lb):  210       BMI:
Gravidity:    4         Term:   2        Prem:   1        SAB:   0
TOP:          0       Ectopic:  0        Living: 2
Fetal Evaluation

Num Of Fetuses:     1
Preg. Location:     Intrauterine
Gest. Sac:          Intrauterine
Yolk Sac:           Not visualized
Fetal Pole:         Visualized
Fetal Heart         153
Rate(bpm):
Cardiac Activity:   Observed
Presentation:       Variable
Placenta:           Posterior
Amniotic Fluid
AFI FV:      Subjectively within normal limits
Biometry

CRL:      73.8  mm     G. Age:  13w 2d                  EDD:   09/20/17
Gestational Age

Best:          13w 0d     Det. By:  Early Ultrasound         EDD:   09/22/17
(02/10/17)
Anatomy

Choroid Plexus:        Appears normal         Bladder:                Appears normal
Thoracic:              Appears normal         Upper Extremities:      Visualized
Stomach:               Appears normal, left   Lower Extremities:      Visualized
sided
Cervix Uterus Adnexa

Cervix
Normal appearance by transvaginal scan. Appears closed, without
funnelling.

Uterus
No abnormality visualized.

Left Ovary
No adnexal mass visualized.

Right Ovary
No adnexal mass visualized.

Cul De Sac:   No free fluid seen.

Adnexa:       No abnormality visualized.
Impression

Singleton intrauterine pregnancy at 13 weeks 0 days
gestation with fetal cardiac activity
Variable presentation
Posterior placenta without evidence of previa
Size consistent with dates
NT 1.6 mm
NB present
Cervical length 
3.5 cm long without funneling
Recommendations

Recommend follow-up ultrasound examination at 
weeks gestation

## 2019-09-06 ENCOUNTER — Other Ambulatory Visit: Payer: Self-pay

## 2019-09-06 ENCOUNTER — Encounter (HOSPITAL_BASED_OUTPATIENT_CLINIC_OR_DEPARTMENT_OTHER): Payer: Self-pay

## 2019-09-06 ENCOUNTER — Emergency Department (HOSPITAL_BASED_OUTPATIENT_CLINIC_OR_DEPARTMENT_OTHER)
Admission: EM | Admit: 2019-09-06 | Discharge: 2019-09-06 | Disposition: A | Discharge status: Home / Self Care | Payer: Medicaid Other | Attending: Emergency Medicine | Admitting: Emergency Medicine

## 2019-09-06 DIAGNOSIS — M545 Low back pain, unspecified: Secondary | ICD-10-CM

## 2019-09-06 DIAGNOSIS — R509 Fever, unspecified: Secondary | ICD-10-CM | POA: Insufficient documentation

## 2019-09-06 DIAGNOSIS — Z79899 Other long term (current) drug therapy: Secondary | ICD-10-CM | POA: Diagnosis not present

## 2019-09-06 MED ORDER — ACETAMINOPHEN 500 MG PO TABS: 500.0000 mg | ORAL_TABLET | Freq: Four times a day (QID) | ORAL | 0 refills | Status: DC | PRN

## 2019-09-06 MED ORDER — CYCLOBENZAPRINE HCL 10 MG PO TABS: 10.0000 mg | ORAL_TABLET | Freq: Two times a day (BID) | ORAL | 0 refills | Status: DC | PRN

## 2019-09-06 MED ORDER — NAPROXEN 500 MG PO TABS: 500.0000 mg | ORAL_TABLET | Freq: Two times a day (BID) | ORAL | 0 refills | Status: DC

## 2019-09-06 MED FILL — CYCLOBENZAPRINE HCL 10 MG T: 10 | 5 days supply | Qty: 10 | Fill #0

## 2019-09-06 MED FILL — NAPROXEN 500 MG TABS: 500 | 15 days supply | Qty: 30 | Fill #0

## 2019-09-06 MED FILL — ACETAMINOPHEN EXTRA STRENGT: 500 | 25 days supply | Qty: 100 | Fill #0

## 2019-09-06 NOTE — ED Notes (Signed)
Pt discharged to home. Discharge instructions have been discussed with patient and/or family members. Pt verbally acknowledges understanding d/c instructions, and endorses comprehension to checkout at registration before leaving. Pharmacy and medications updated with patient

## 2019-09-06 NOTE — ED Provider Notes (Signed)
[facility]ERG[facility] DEPARTMENT Provider Note   CSN: [id number] Arrival date & time: 09/06/19  1512     History Chief Complaint  Patient presents with  . Back Pain    Ashley Wilkerson is a 39 y.o. female with history of obesity, sickle cell trait presents for evaluation of acute onset, persistent intermittent left-sided low back pain for 2 weeks.  No known trauma or bending lifting or twisting injury.  She reports intermittent aching pain to the left mid low back which does not radiate.  It worsens with sitting upright for long periods of time or laying flat but improves with standing and stretching.  She denies fever, numbness or weakness involving the extremities, radiation of pain down the extremities, bowel or bladder incontinence, saddle anesthesia, or history of IV drug use.  She denies urinary symptoms, abdominal pain, nausea, or vomiting. She has taken 400 mg of ibuprofen when the pain has become more severe with improvement in her pain.  The history is provided by the patient.       Past Medical History:  Diagnosis Date  . Abnormal Pap smear   . Prior pregnancy with fetal demise 04/2016   [redacted] weeks gestation   . SVD (spontaneous vaginal delivery)    x 3 - 2 living, 1 fetal demise at [redacted] wks gestation  . Vaginal Pap smear, abnormal     Patient Active Problem List   Diagnosis Date Noted  . Sickle cell trait ([place]) 02/13/2017  . Obesity (BMI 30-39.9) 02/10/2017    Past Surgical History:  Procedure Laterality Date  . CERVICAL CERCLAGE  04/2016   [redacted] wks gestation - fetal demise  . CERVICAL CERCLAGE N/A 03/26/2017   Procedure: CERCLAGE CERVICAL;  Surgeon: Osborne Oman, MD;  Location: [facility];  Service: Gynecology;  Laterality: N/A;  . CRYOTHERAPY    . TUBAL LIGATION Bilateral 08/24/2017   Procedure: POST PARTUM TUBAL LIGATION;  Surgeon: Chancy Milroy, MD;  Location: [facility];  Service: Gynecology;  Laterality: Bilateral;  . WISDOM  TOOTH EXTRACTION       OB History    Gravida  4   Para  4   Term  2   Preterm  1   AB      Living  3     SAB      TAB      Ectopic      Multiple  0   Live Births  3           Family History  Problem Relation Age of Onset  . Hyperlipidemia Mother   . Hypertension Mother   . Cancer Neg Hx   . Birth defects Neg Hx     Social History   Tobacco Use  . Smoking status: Never Smoker  . Smokeless tobacco: Never Used  Substance Use Topics  . Alcohol use: No  . Drug use: No    Home Medications Prior to Admission medications   Medication Sig Start Date End Date Taking? Authorizing Provider  acetaminophen (TYLENOL) 500 MG tablet Take 1 tablet (500 mg total) by mouth every 6 (six) hours as needed. 09/06/19   Shaniquia Brafford A, PA-C  cetirizine (ZYRTEC ALLERGY) 10 MG tablet Take 1 tablet (10 mg total) by mouth daily. 10/25/17   Law, Bea Graff, PA-C  cyclobenzaprine (FLEXERIL) 10 MG tablet Take 1 tablet (10 mg total) by mouth 2 (two) times daily as needed for muscle spasms. 09/06/19   Renita Papa,  PA-C  ibuprofen (ADVIL,MOTRIN) 600 MG tablet Take 1 tablet (600 mg total) by mouth every 6 (six) hours as needed. 10/25/17   Emi Holes, PA-C  magic mouthwash w/lidocaine SOLN Take 5 mLs by mouth 4 (four) times daily. 11/02/17   Cuthriell, Delorise Royals, PA-C  naproxen (NAPROSYN) 500 MG tablet Take 1 tablet (500 mg total) by mouth 2 (two) times daily with a meal. 09/06/19   Cilicia Borden A, PA-C  Prenatal Vit-Fe Fumarate-FA (MULTIVITAMIN-PRENATAL) 27-0.8 MG TABS tablet Take 1 tablet by mouth daily at 12 noon.    [provider]    Allergies    Patient has no known allergies.  Review of Systems   Review of Systems  Constitutional: Positive for chills and fever.  Gastrointestinal: Negative for abdominal pain, nausea and vomiting.  Genitourinary: Negative for dysuria, frequency, hematuria and urgency.  Musculoskeletal: Positive for back pain.  Neurological: Negative for  weakness and numbness.    Physical Exam Updated Vital Signs BP 138/86 (BP Location: Right Arm)   Pulse 88   Temp 98.1 F (36.7 C) (Oral)   Resp 18   Ht 5' (1.524 m)   Wt 88.5 kg   LMP 08/30/2019   SpO2 99%   BMI 38.08 kg/m   Physical Exam Vitals and nursing note reviewed.  Constitutional:      General: She is not in acute distress.    Appearance: She is well-developed.  HENT:     Head: Normocephalic and atraumatic.  Eyes:     General:        Right eye: No discharge.        Left eye: No discharge.     Conjunctiva/sclera: Conjunctivae normal.  Neck:     Vascular: No JVD.     Trachea: No tracheal deviation.  Cardiovascular:     Rate and Rhythm: Normal rate.  Pulmonary:     Effort: Pulmonary effort is normal.  Abdominal:     General: There is no distension.     Palpations: Abdomen is soft.  Musculoskeletal:        General: Tenderness present.     Comments: No midline spine tenderness, no deformity crepitus or stepoff noted.  Focal left paralumbar muscle tenderness.  5/5 strength of BLE major muscle groups.  Normal range of motion of the lumbar spine with no pain elicited with flexion, extension, or lateral rotation.  Skin:    General: Skin is warm and dry.     Findings: No erythema.  Neurological:     Mental Status: She is alert.     Comments: Speech is fluent and goal oriented.  Sensation is intact to light touch of the bilateral lower extremities.  She is ambulatory with steady gait and balance and is able to heel walk and toe walk without difficulty.  Psychiatric:        Behavior: Behavior normal.     ED Results / Procedures / Treatments   Labs (all labs ordered are listed, but only abnormal results are displayed) Labs Reviewed - No data to display  EKG None  Radiology No results found.  Procedures Procedures (including critical care time)  Medications Ordered in ED Medications - No data to display  ED Course  I have reviewed the triage vital  signs and the nursing notes.  Pertinent labs & imaging results that were available during my care of the patient were reviewed by me and considered in my medical decision making (see chart for details).    MDM  Rules/Calculators/A&P                      Patient with left lumbar back pain, reproducible on palpation.  Patient is afebrile, vital signs are stable.  She is nontoxic in appearance.  She is ambulatory in the ED despite pain.  No urinary symptoms to suggest UTI or pyelonephritis.  No abdominal pain.  Doubt dissection.  No red flag signs concerning for cauda equina or spinal abscess. No neurological deficits and normal neuro exam.  Conservative therapy indicated and discussed with patient including NSAIDs, Tylenol, gentle stretching, heat therapy, Flexeril.  Advised of appropriate use of medications and potential side effects.  Recommend follow-up with PCP for reevaluation of symptoms.  Discussed strict ED return precautions. Pt verbalized understanding of and agreement with plan and is safe for discharge home at this time.   Final Clinical Impression(s) / ED Diagnoses Final diagnoses:  Acute left-sided low back pain without sciatica    Rx / DC Orders ED Discharge Orders         Ordered    naproxen (NAPROSYN) 500 MG tablet  2 times daily with meals     09/06/19 1601    acetaminophen (TYLENOL) 500 MG tablet  Every 6 hours PRN     09/06/19 1601    cyclobenzaprine (FLEXERIL) 10 MG tablet  2 times daily PRN     09/06/19 1601           Jeanie Sewer, PA-C 09/06/19 1609    Maia Plan, MD 09/08/19 [phone]

## 2019-09-06 NOTE — ED Triage Notes (Signed)
Pt c/o left lower back pain X2 weeks, pt has not taken any medication at home reports the pain is worse when sitting for long periods of time. Denies urinary symptoms.

## 2019-09-06 NOTE — Discharge Instructions (Signed)
1. Medications: Take naproxen twice daily with meals.  Do not take ibuprofen, Advil, Aleve, or Motrin with this medication.  You can however take [phone] mg of Tylenol every 6 hours as needed for pain additionally. Do not exceed 4000 mg of Tylenol daily.  You can take Flexeril as needed for muscle spasm up to twice daily but do not drive, drink alcohol, or operate heavy machinery while taking this medicine because it may make you drowsy.  I typically recommend taking this medicine only at night when you are going to sleep.  He can also cut these tablets in half if they make you feel very drowsy. 2. Treatment: rest, drink plenty of fluids, gentle stretching as discussed (see attached), alternate ice and heat (or stick with whichever feels best) 20 minutes on 20 minutes off. 3. Follow Up: Please followup with your primary doctor in 3-7 days for discussion of your diagnoses and further evaluation after today's visit; if you do not have a primary care doctor use the resource guide provided to find one;  Return to the ER for worsening back pain, difficulty walking, loss of bowel or bladder control or other concerning symptoms

## 2019-09-10 DIAGNOSIS — M545 Low back pain: Secondary | ICD-10-CM | POA: Diagnosis not present

## 2020-01-27 ENCOUNTER — Other Ambulatory Visit: Payer: Medicaid Other

## 2020-01-27 ENCOUNTER — Other Ambulatory Visit: Payer: Self-pay | Admitting: Critical Care Medicine

## 2020-01-27 DIAGNOSIS — Z20822 Contact with and (suspected) exposure to covid-19: Secondary | ICD-10-CM

## 2020-01-29 LAB — NOVEL CORONAVIRUS, NAA: SARS-CoV-2, NAA: NOT DETECTED

## 2020-01-29 LAB — SARS-COV-2, NAA 2 DAY TAT

## 2020-07-01 DIAGNOSIS — Z20822 Contact with and (suspected) exposure to covid-19: Secondary | ICD-10-CM | POA: Diagnosis not present

## 2020-07-02 ENCOUNTER — Other Ambulatory Visit (HOSPITAL_BASED_OUTPATIENT_CLINIC_OR_DEPARTMENT_OTHER): Payer: Self-pay | Admitting: Family Medicine

## 2020-07-02 DIAGNOSIS — Z20822 Contact with and (suspected) exposure to covid-19: Secondary | ICD-10-CM | POA: Diagnosis not present

## 2020-07-02 DIAGNOSIS — Z03818 Encounter for observation for suspected exposure to other biological agents ruled out: Secondary | ICD-10-CM | POA: Diagnosis not present

## 2020-07-02 MED FILL — NORETHINDRONE 5 MG TABLET: 5 | 10 days supply | Qty: 30 | Fill #0

## 2020-11-26 ENCOUNTER — Other Ambulatory Visit: Payer: Self-pay

## 2020-11-26 ENCOUNTER — Other Ambulatory Visit (HOSPITAL_COMMUNITY)
Admission: RE | Admit: 2020-11-26 | Discharge: 2020-11-26 | Disposition: A | Discharge status: Home / Self Care | Payer: Medicaid Other | Source: Ambulatory Visit | Attending: Obstetrics and Gynecology | Admitting: Obstetrics and Gynecology

## 2020-11-26 ENCOUNTER — Ambulatory Visit (INDEPENDENT_AMBULATORY_CARE_PROVIDER_SITE_OTHER): Payer: Medicaid Other | Admitting: Obstetrics and Gynecology

## 2020-11-26 ENCOUNTER — Encounter: Payer: Self-pay | Admitting: Obstetrics and Gynecology

## 2020-11-26 VITALS — BP 124/85 | HR 102 | Ht 60.0 in | Wt 212.8 lb

## 2020-11-26 DIAGNOSIS — Z113 Encounter for screening for infections with a predominantly sexual mode of transmission: Secondary | ICD-10-CM | POA: Diagnosis not present

## 2020-11-26 DIAGNOSIS — Z01419 Encounter for gynecological examination (general) (routine) without abnormal findings: Secondary | ICD-10-CM | POA: Insufficient documentation

## 2020-11-26 NOTE — Progress Notes (Signed)
Obstetrics and Gynecology Annual Patient Evaluation  Appointment Date: 11/26/2020  OBGYN Clinic: [facility]  Primary Care Provider: Renaye Rakers  Chief Complaint:  Chief Complaint  Patient presents with   Gynecologic Exam    History of Present Illness: Ashley Wilkerson is a 40 y.o. [phone]  seen for the above chief complaint. Her past medical history is significant for h/o ppBTL    Review of Systems: Pertinent items are noted in HPI.    Patient Active Problem List   Diagnosis Date Noted   Sickle cell trait (H[facility]) 02/13/2017   Obesity (BMI 30-39.9) 02/10/2017    Past Medical History:  Past Medical History:  Diagnosis Date   Abnormal Pap smear    Prior pregnancy with fetal demise 04/2016   [redacted] weeks gestation    SVD (spontaneous vaginal delivery)    x 3 - 2 living, 1 fetal demise at [redacted] wks gestation   Vaginal Pap smear, abnormal     Past Surgical History:  Past Surgical History:  Procedure Laterality Date   CERVICAL CERCLAGE  04/2016   [redacted] wks gestation - fetal demise   CERVICAL CERCLAGE N/A 03/26/2017   Procedure: CERCLAGE CERVICAL;  Surgeon: Tereso Newcomer, MD;  Location: [facility];  Service: Gynecology;  Laterality: N/A;   CRYOTHERAPY     TUBAL LIGATION Bilateral 08/24/2017   Procedure: POST PARTUM TUBAL LIGATION;  Surgeon: Hermina Staggers, MD;  Location: [facility];  Service: Gynecology;  Laterality: Bilateral;   WISDOM TOOTH EXTRACTION      Past Obstetrical History:  OB History  Gravida Para Term Preterm AB Living  4 4 2 1   3   SAB IAB Ectopic Multiple Live Births        0 3    # Outcome Date GA Lbr Len/2nd Weight Sex Delivery Anes PTL Lv  4 Preterm 08/24/17 [redacted]w[redacted]d / 00:14 5 lb 7.8 oz (2.49 kg) F Vag-Spont EPI  LIV  3 Para 04/22/16 [redacted]w[redacted]d   (0 kg) M Vag-Breech None  FD  2 Term 02/06/03 [redacted]w[redacted]d  6 lb 4 oz (2.835 kg) F Vag-Spont  N LIV  1 Term 05/28/96 [redacted]w[redacted]d  6 lb 8 oz (2.948 kg) M Vag-Spont  N     Past  Gynecological History: As per HPI. Periods: qmonth, regular, 5-7d, not heavy or painful History of Pap Smear(s): Yes.   Last pap 2018, which was negative  Social History:  Social History   Socioeconomic History   Marital status: Married    Spouse name: Not on file   Number of children: Not on file   Years of education: Not on file   Highest education level: Not on file  Occupational History   Not on file  Tobacco Use   Smoking status: Never   Smokeless tobacco: Never  Vaping Use   Vaping Use: Never used  Substance and Sexual Activity   Alcohol use: No   Drug use: No   Sexual activity: Yes    Birth control/protection: Surgical  Other Topics Concern   Not on file  Social History Narrative   Not on file   Social Determinants of Health   Financial Resource Strain: Not on file  Food Insecurity: Not on file  Transportation Needs: Not on file  Physical Activity: Not on file  Stress: Not on file  Social Connections: Not on file  Intimate Partner Violence: Not on file    Family History:  Family History  Problem Relation Age of  Onset   Hyperlipidemia Mother    Hypertension Mother    Cancer Neg Hx    Birth defects Neg Hx    She denies any female cancers  Medications Ikesha M. [place] had no medications administered during this visit. Current Outpatient Medications  Medication Sig Dispense Refill   acetaminophen (TYLENOL) 500 MG tablet Take 1 tablet (500 mg total) by mouth every 6 (six) hours as needed. 30 tablet 0   cetirizine (ZYRTEC ALLERGY) 10 MG tablet Take 1 tablet (10 mg total) by mouth daily. 30 tablet 0   No current facility-administered medications for this visit.    Allergies Patient has no known allergies.   Physical Exam:  BP 124/85   Pulse (!) 102   Ht 5' (1.524 m)   Wt 212 lb 12.8 oz (96.5 kg)   BMI 41.56 kg/m  Body mass index is 41.56 kg/m. General appearance: Well nourished, well developed female in no acute distress.  Neck:   Supple, normal appearance, and no thyromegaly  Cardiovascular: normal s1 and s2.  No murmurs, rubs or gallops. Respiratory:  Clear to auscultation bilateral. Normal respiratory effort Abdomen: positive bowel sounds and no masses, hernias; diffusely non tender to palpation, non distended Breasts: breasts appear normal, no suspicious masses, no skin or nipple changes or axillary nodes, and normal palpation. Neuro/Psych:  Normal mood and affect.  Skin:  Warm and dry.  Lymphatic:  No inguinal lymphadenopathy.   Pelvic exam: is not limited by body habitus EGBUS: within normal limits Vagina: within normal limits and with no blood or discharge in the vault Cervix: normal appearing cervix without tenderness, discharge or lesions. Uterus:  nonenlarged and non tender Adnexa:  normal adnexa and no mass, fullness, tenderness Rectovaginal: deferred  Laboratory: none  Radiology: none  Assessment: pt doing well  Plan:  Patient doing well and no issues or problems. Patient amenable to screening mammo and will schedule at check out  Patient would like STI bloodwork today. Follow up pap smear today  RTC 1 year and PRN  Cornelia Copa MD Attending [facility])

## 2020-11-27 LAB — HEPATITIS C ANTIBODY: Hep C Virus Ab: <0.1 s/co ratio (ref 0.0–0.9)

## 2020-11-27 LAB — HEPATITIS B SURFACE ANTIGEN: Hepatitis B Surface Ag: NEGATIVE

## 2020-11-27 LAB — RPR: RPR Ser Ql: NONREACTIVE

## 2020-11-27 LAB — HIV ANTIBODY (ROUTINE TESTING W REFLEX): HIV Screen 4th Generation wRfx: NONREACTIVE

## 2020-11-28 LAB — CYTOLOGY - PAP
Chlamydia: NEGATIVE
Comment: NEGATIVE
Comment: NEGATIVE
Comment: NEGATIVE
Comment: NORMAL
Diagnosis: NEGATIVE
High risk HPV: NEGATIVE
Neisseria Gonorrhea: NEGATIVE
Trichomonas: NEGATIVE

## 2020-11-29 ENCOUNTER — Ambulatory Visit
Admission: RE | Admit: 2020-11-29 | Discharge: 2020-11-29 | Disposition: A | Discharge status: Home / Self Care | Payer: Medicaid Other | Source: Ambulatory Visit | Attending: Obstetrics and Gynecology | Admitting: Obstetrics and Gynecology

## 2020-11-29 ENCOUNTER — Other Ambulatory Visit: Payer: Self-pay

## 2020-11-29 DIAGNOSIS — Z01419 Encounter for gynecological examination (general) (routine) without abnormal findings: Secondary | ICD-10-CM

## 2021-10-18 NOTE — Patient Instructions (Signed)

## 2021-10-22 ENCOUNTER — Encounter: Payer: Self-pay | Admitting: Physician Assistant

## 2021-10-22 ENCOUNTER — Ambulatory Visit (INDEPENDENT_AMBULATORY_CARE_PROVIDER_SITE_OTHER): Payer: Medicaid Other | Admitting: Physician Assistant

## 2021-10-22 VITALS — BP 129/80 | HR 95 | Temp 97.7°F | Ht 60.0 in | Wt 216.0 lb

## 2021-10-22 DIAGNOSIS — D573 Sickle-cell trait: Secondary | ICD-10-CM

## 2021-10-22 DIAGNOSIS — Z7689 Persons encountering health services in other specified circumstances: Secondary | ICD-10-CM

## 2021-10-22 DIAGNOSIS — J302 Other seasonal allergic rhinitis: Secondary | ICD-10-CM

## 2021-10-22 NOTE — Progress Notes (Signed)
New Patient Office Visit  Subjective    Patient ID: SADAE ARRAZOLA, female    DOB: March 31, 1981  Age: 41 y.o. MRN: [id number]  CC:  Chief Complaint  Patient presents with   New Patient (Initial Visit)    HPI Ashley Wilkerson presents to establish care. Patient has a past medical history of sickle cell trait. Patient does not take prescribed medications. Does take Zyrtec when needed for seasonal allergies. Sometimes will take a multi-vitamin. Family history is pertinent for high cholesterol, hypertension and diabetes mellitus. Patient has never been a smoker, drinks socially and drinks about 2 cups of caffeine daily. Patient does not have a regular exercise routine.    Outpatient Encounter Medications as of 10/22/2021  Medication Sig   [DISCONTINUED] acetaminophen (TYLENOL) 500 MG tablet Take 1 tablet (500 mg total) by mouth every 6 (six) hours as needed.   [DISCONTINUED] cetirizine (ZYRTEC ALLERGY) 10 MG tablet Take 1 tablet (10 mg total) by mouth daily.   No facility-administered encounter medications on file as of 10/22/2021.    Past Medical History:  Diagnosis Date   Abnormal Pap smear    Prior pregnancy with fetal demise 04/2016   [redacted] weeks gestation    SVD (spontaneous vaginal delivery)    x 3 - 2 living, 1 fetal demise at [redacted] wks gestation   Vaginal Pap smear, abnormal     Past Surgical History:  Procedure Laterality Date   CERVICAL CERCLAGE  04/2016   [redacted] wks gestation - fetal demise   CERVICAL CERCLAGE N/A 03/26/2017   Procedure: CERCLAGE CERVICAL;  Surgeon: Tereso Newcomer, MD;  Location: [facility];  Service: Gynecology;  Laterality: N/A;   CRYOTHERAPY     TUBAL LIGATION Bilateral 08/24/2017   Procedure: POST PARTUM TUBAL LIGATION;  Surgeon: Hermina Staggers, MD;  Location: [facility];  Service: Gynecology;  Laterality: Bilateral;   WISDOM TOOTH EXTRACTION      Family History  Problem Relation Age of Onset   Hyperlipidemia Mother     Hypertension Mother    Cancer Neg Hx    Birth defects Neg Hx     Social History   Socioeconomic History   Marital status: Married    Spouse name: Not on file   Number of children: Not on file   Years of education: Not on file   Highest education level: Not on file  Occupational History   Not on file  Tobacco Use   Smoking status: Never   Smokeless tobacco: Never  Vaping Use   Vaping Use: Never used  Substance and Sexual Activity   Alcohol use: No   Drug use: No   Sexual activity: Yes    Birth control/protection: Surgical  Other Topics Concern   Not on file  Social History Narrative   Not on file   Social Determinants of Health   Financial Resource Strain: Not on file  Food Insecurity: Not on file  Transportation Needs: Not on file  Physical Activity: Not on file  Stress: Not on file  Social Connections: Not on file  Intimate Partner Violence: Not on file    ROS Review of Systems:  A fourteen system review of systems was performed and found to be positive as per HPI.    Objective    BP 129/80   Pulse 95   Temp 97.7 F (36.5 C)   Ht 5' (1.524 m)   Wt 216 lb (98 kg)   LMP  (LMP Unknown)  SpO2 100%   BMI 42.18 kg/m   Physical Exam General:  Well Developed, well nourished, appropriate for stated age.  Neuro:  Alert and oriented,  extra-ocular muscles intact  HEENT:  Normocephalic, atraumatic, neck supple  Skin:  no gross rash, warm, pink. Cardiac:  RRR, S1 S2 Respiratory: CTA B/L  Vascular:  Ext warm, no cyanosis apprec.; cap RF less 2 sec. Psych:  No HI/SI, judgement and insight good, Euthymic mood. Full Affect.      Assessment & Plan:   Problem List Items Addressed This Visit       Other   Sickle cell trait (HCC) - Primary   Other Visit Diagnoses     Encounter to establish care       Seasonal allergies          Encounter to establish care: -Reviewed records in Epic. Patient followed by OB/GYN. UTD on pap and mammogram. Recommend to  schedule CPE and FBW.  Seasonal allergies: -Recommend to continue with Zyrtec 10 mg as needed.    Return in about 6 weeks (around 12/03/2021) for CPE with FBW few days before.   Mayer Masker, PA-C

## 2021-11-27 ENCOUNTER — Ambulatory Visit: Payer: Medicaid Other | Admitting: Obstetrics & Gynecology

## 2021-12-04 ENCOUNTER — Other Ambulatory Visit: Payer: Self-pay

## 2021-12-04 DIAGNOSIS — Z130 Encounter for screening for diseases of the blood and blood-forming organs and certain disorders involving the immune mechanism: Secondary | ICD-10-CM

## 2021-12-04 DIAGNOSIS — Z0000 Encounter for general adult medical examination without abnormal findings: Secondary | ICD-10-CM

## 2021-12-05 ENCOUNTER — Other Ambulatory Visit: Payer: Medicaid Other

## 2021-12-05 DIAGNOSIS — Z0000 Encounter for general adult medical examination without abnormal findings: Secondary | ICD-10-CM

## 2021-12-05 DIAGNOSIS — Z13228 Encounter for screening for other metabolic disorders: Secondary | ICD-10-CM

## 2021-12-05 DIAGNOSIS — Z1321 Encounter for screening for nutritional disorder: Secondary | ICD-10-CM | POA: Diagnosis not present

## 2021-12-05 DIAGNOSIS — Z130 Encounter for screening for diseases of the blood and blood-forming organs and certain disorders involving the immune mechanism: Secondary | ICD-10-CM | POA: Diagnosis not present

## 2021-12-05 DIAGNOSIS — Z1329 Encounter for screening for other suspected endocrine disorder: Secondary | ICD-10-CM | POA: Diagnosis not present

## 2021-12-06 LAB — CBC WITH DIFFERENTIAL/PLATELET
Basophils Absolute: 0.1 10*3/uL (ref 0.0–0.2)
Basos: 1 %
EOS (ABSOLUTE): 0.3 10*3/uL (ref 0.0–0.4)
Eos: 4 %
Hematocrit: 39 % (ref 34.0–46.6)
Hemoglobin: 12.6 g/dL (ref 11.1–15.9)
Immature Grans (Abs): 0 10*3/uL (ref 0.0–0.1)
Immature Granulocytes: 0 %
Lymphocytes Absolute: 2.8 10*3/uL (ref 0.7–3.1)
Lymphs: 39 %
MCH: 27.2 pg (ref 26.6–33.0)
MCHC: 32.3 g/dL (ref 31.5–35.7)
MCV: 84 fL (ref 79–97)
Monocytes Absolute: 0.5 10*3/uL (ref 0.1–0.9)
Monocytes: 7 %
Neutrophils Absolute: 3.5 10*3/uL (ref 1.4–7.0)
Neutrophils: 49 %
Platelets: 247 10*3/uL (ref 150–450)
RBC: 4.63 x10E6/uL (ref 3.77–5.28)
RDW: 15 % (ref 11.7–15.4)
WBC: 7.1 10*3/uL (ref 3.4–10.8)

## 2021-12-06 LAB — COMPREHENSIVE METABOLIC PANEL
ALT: 17 IU/L (ref 0–32)
AST: 19 IU/L (ref 0–40)
Albumin/Globulin Ratio: 1.6 (ref 1.2–2.2)
Albumin: 4.2 g/dL (ref 3.8–4.8)
Alkaline Phosphatase: 78 IU/L (ref 44–121)
BUN/Creatinine Ratio: 19 (ref 9–23)
BUN: 15 mg/dL (ref 6–24)
Bilirubin Total: <0.2 mg/dL (ref 0.0–1.2)
CO2: 24 mmol/L (ref 20–29)
Calcium: 9.2 mg/dL (ref 8.7–10.2)
Chloride: 102 mmol/L (ref 96–106)
Creatinine, Ser: 0.8 mg/dL (ref 0.57–1.00)
Globulin, Total: 2.6 g/dL (ref 1.5–4.5)
Glucose: 97 mg/dL (ref 70–99)
Potassium: 4.5 mmol/L (ref 3.5–5.2)
Sodium: 138 mmol/L (ref 134–144)
Total Protein: 6.8 g/dL (ref 6.0–8.5)
eGFR: 95 mL/min/{1.73_m2} (ref >59)

## 2021-12-06 LAB — LIPID PANEL
Chol/HDL Ratio: 3 ratio (ref 0.0–4.4)
Cholesterol, Total: 244 mg/dL — ABNORMAL HIGH (ref 100–199)
HDL: 81 mg/dL (ref >39)
LDL Chol Calc (NIH): 144 mg/dL — ABNORMAL HIGH (ref 0–99)
Triglycerides: 111 mg/dL (ref 0–149)
VLDL Cholesterol Cal: 19 mg/dL (ref 5–40)

## 2021-12-06 LAB — HEMOGLOBIN A1C
Est. average glucose Bld gHb Est-mCnc: 111 mg/dL
Hgb A1c MFr Bld: 5.5 % (ref 4.8–5.6)

## 2021-12-06 LAB — TSH: TSH: 2.68 u[IU]/mL (ref 0.450–4.500)

## 2021-12-12 ENCOUNTER — Encounter: Payer: Self-pay | Admitting: Physician Assistant

## 2021-12-12 ENCOUNTER — Ambulatory Visit (INDEPENDENT_AMBULATORY_CARE_PROVIDER_SITE_OTHER): Payer: Medicaid Other | Admitting: Physician Assistant

## 2021-12-12 VITALS — BP 105/72 | HR 96 | Temp 97.7°F | Ht 60.0 in | Wt 215.0 lb

## 2021-12-12 DIAGNOSIS — M6740 Ganglion, unspecified site: Secondary | ICD-10-CM

## 2021-12-12 DIAGNOSIS — E785 Hyperlipidemia, unspecified: Secondary | ICD-10-CM | POA: Diagnosis not present

## 2021-12-12 DIAGNOSIS — Z0000 Encounter for general adult medical examination without abnormal findings: Secondary | ICD-10-CM

## 2021-12-12 NOTE — Patient Instructions (Signed)
Preventive Care 41-41 Years Old, Female Preventive care refers to lifestyle choices and visits with your health care provider that can promote health and wellness. Preventive care visits are also called wellness exams. What can I expect for my preventive care visit? Counseling Your health care provider may ask you questions about your: Medical history, including: Past medical problems. Family medical history. Pregnancy history. Current health, including: Menstrual cycle. Method of birth control. Emotional well-being. Home life and relationship well-being. Sexual activity and sexual health. Lifestyle, including: Alcohol, nicotine or tobacco, and drug use. Access to firearms. Diet, exercise, and sleep habits. Work and work environment. Sunscreen use. Safety issues such as seatbelt and bike helmet use. Physical exam Your health care provider will check your: Height and weight. These may be used to calculate your BMI (body mass index). BMI is a measurement that tells if you are at a healthy weight. Waist circumference. This measures the distance around your waistline. This measurement also tells if you are at a healthy weight and may help predict your risk of certain diseases, such as type 2 diabetes and high blood pressure. Heart rate and blood pressure. Body temperature. Skin for abnormal spots. What immunizations do I need?  Vaccines are usually given at various ages, according to a schedule. Your health care provider will recommend vaccines for you based on your age, medical history, and lifestyle or other factors, such as travel or where you work. What tests do I need? Screening Your health care provider may recommend screening tests for certain conditions. This may include: Lipid and cholesterol levels. Diabetes screening. This is done by checking your blood sugar (glucose) after you have not eaten for a while (fasting). Pelvic exam and Pap test. Hepatitis B test. Hepatitis C  test. HIV (human immunodeficiency virus) test. STI (sexually transmitted infection) testing, if you are at risk. Lung cancer screening. Colorectal cancer screening. Mammogram. Talk with your health care provider about when you should start having regular mammograms. This may depend on whether you have a family history of breast cancer. BRCA-related cancer screening. This may be done if you have a family history of breast, ovarian, tubal, or peritoneal cancers. Bone density scan. This is done to screen for osteoporosis. Talk with your health care provider about your test results, treatment options, and if necessary, the need for more tests. Follow these instructions at home: Eating and drinking  Eat a diet that includes fresh fruits and vegetables, whole grains, lean protein, and low-fat dairy products. Take vitamin and mineral supplements as recommended by your health care provider. Do not drink alcohol if: Your health care provider tells you not to drink. You are pregnant, may be pregnant, or are planning to become pregnant. If you drink alcohol: Limit how much you have to 0-1 drink a day. Know how much alcohol is in your drink. In the [place] one drink equals one 12 oz bottle of beer (355 mL), one 5 oz glass of wine (148 mL), or one 1 oz glass of hard liquor (44 mL). Lifestyle Brush your teeth every morning and night with fluoride toothpaste. Floss one time each day. Exercise for at least 30 minutes 5 or more days each week. Do not use any products that contain nicotine or tobacco. These products include cigarettes, chewing tobacco, and vaping devices, such as e-cigarettes. If you need help quitting, ask your health care provider. Do not use drugs. If you are sexually active, practice safe sex. Use a condom or other form of protection to   prevent STIs. If you do not wish to become pregnant, use a form of birth control. If you plan to become pregnant, see your health care provider for a  prepregnancy visit. Take aspirin only as told by your health care provider. Make sure that you understand how much to take and what form to take. Work with your health care provider to find out whether it is safe and beneficial for you to take aspirin daily. Find healthy ways to manage stress, such as: Meditation, yoga, or listening to music. Journaling. Talking to a trusted person. Spending time with friends and family. Minimize exposure to UV radiation to reduce your risk of skin cancer. Safety Always wear your seat belt while driving or riding in a vehicle. Do not drive: If you have been drinking alcohol. Do not ride with someone who has been drinking. When you are tired or distracted. While texting. If you have been using any mind-altering substances or drugs. Wear a helmet and other protective equipment during sports activities. If you have firearms in your house, make sure you follow all gun safety procedures. Seek help if you have been physically or sexually abused. What's next? Visit your health care provider once a year for an annual wellness visit. Ask your health care provider how often you should have your eyes and teeth checked. Stay up to date on all vaccines. This information is not intended to replace advice given to you by your health care provider. Make sure you discuss any questions you have with your health care provider. Document Revised: 11/14/2020 Document Reviewed: 11/14/2020 Elsevier Patient Education  [place].

## 2021-12-12 NOTE — Progress Notes (Signed)
Complete physical exam   Patient: Ashley Wilkerson   DOB: Jan 08, 1981   41 y.o. Female  MRN: [id number] Visit Date: 12/12/2021   Chief Complaint  Patient presents with   Annual Exam   Subjective    Ashley Wilkerson is a 41 y.o. female who presents today for a complete physical exam.  She reports consuming a general diet. The patient does not participate in regular exercise at present. Tries to stay as active as possible.  She generally feels well. She does not have additional problems to discuss today.    Past Medical History:  Diagnosis Date   Abnormal Pap smear    Prior pregnancy with fetal demise 04/2016   [redacted] weeks gestation    SVD (spontaneous vaginal delivery)    x 3 - 2 living, 1 fetal demise at [redacted] wks gestation   Vaginal Pap smear, abnormal    Past Surgical History:  Procedure Laterality Date   CERVICAL CERCLAGE  04/2016   [redacted] wks gestation - fetal demise   CERVICAL CERCLAGE N/A 03/26/2017   Procedure: CERCLAGE CERVICAL;  Surgeon: Osborne Oman, MD;  Location: [facility];  Service: Gynecology;  Laterality: N/A;   CRYOTHERAPY     TUBAL LIGATION Bilateral 08/24/2017   Procedure: POST PARTUM TUBAL LIGATION;  Surgeon: Chancy Milroy, MD;  Location: [facility];  Service: Gynecology;  Laterality: Bilateral;   WISDOM TOOTH EXTRACTION     Social History   Socioeconomic History   Marital status: Married    Spouse name: Not on file   Number of children: Not on file   Years of education: Not on file   Highest education level: Not on file  Occupational History   Not on file  Tobacco Use   Smoking status: Never   Smokeless tobacco: Never  Vaping Use   Vaping Use: Never used  Substance and Sexual Activity   Alcohol use: No   Drug use: No   Sexual activity: Yes    Birth control/protection: Surgical  Other Topics Concern   Not on file  Social History Narrative   Not on file   Social Determinants of Health   Financial Resource  Strain: Not on file  Food Insecurity: Not on file  Transportation Needs: Not on file  Physical Activity: Not on file  Stress: Not on file  Social Connections: Not on file  Intimate Partner Violence: Not on file     Medications: No outpatient medications prior to visit.   No facility-administered medications prior to visit.    Review of Systems Review of Systems:  A fourteen system review of systems was performed and found to be positive as per HPI.  Last CBC Lab Results  Component Value Date   WBC 7.1 12/05/2021   HGB 12.6 12/05/2021   HCT 39.0 12/05/2021   MCV 84 12/05/2021   MCH 27.2 12/05/2021   RDW 15.0 12/05/2021   PLT 247 37/48/2707   Last metabolic panel Lab Results  Component Value Date   GLUCOSE 97 12/05/2021   NA 138 12/05/2021   K 4.5 12/05/2021   CL 102 12/05/2021   CO2 24 12/05/2021   BUN 15 12/05/2021   CREATININE 0.80 12/05/2021   EGFR 95 12/05/2021   CALCIUM 9.2 12/05/2021   PROT 6.8 12/05/2021   ALBUMIN 4.2 12/05/2021   LABGLOB 2.6 12/05/2021   AGRATIO 1.6 12/05/2021   BILITOT <0.2 12/05/2021   ALKPHOS 78 12/05/2021   AST 19 12/05/2021   ALT 17  12/05/2021   ANIONGAP 12 08/24/2017   Last lipids Lab Results  Component Value Date   CHOL 244 (H) 12/05/2021   HDL 81 12/05/2021   LDLCALC 144 (H) 12/05/2021   TRIG 111 12/05/2021   CHOLHDL 3.0 12/05/2021   Last hemoglobin A1c Lab Results  Component Value Date   HGBA1C 5.5 12/05/2021   Last thyroid functions Lab Results  Component Value Date   TSH 2.680 12/05/2021   Last vitamin D No results found for: "25OHVITD2", "25OHVITD3", "VD25OH"  Objective     BP 105/72   Pulse 96   Temp 97.7 F (36.5 C)   Ht 5' (1.524 m)   Wt 215 lb (97.5 kg)   LMP  (LMP Unknown)   SpO2 100%   Breastfeeding No   BMI 41.99 kg/m  BP Readings from Last 3 Encounters:  12/12/21 105/72  10/22/21 129/80  11/26/20 124/85   Wt Readings from Last 3 Encounters:  12/12/21 215 lb (97.5 kg)  10/22/21  216 lb (98 kg)  11/26/20 212 lb 12.8 oz (96.5 kg)    Physical Exam   General Appearance:    Alert, cooperative, in no acute distress, appears stated age   Head:    Normocephalic, without obvious abnormality, atraumatic  Eyes:    PERRL, conjunctiva/corneas clear, EOM's intact, fundi    benign, both eyes  Ears:    Normal TM's and external ear canals, both ears  Nose:   Nares normal, septum midline, mucosa normal, no drainage    or sinus tenderness  Throat:   Lips, mucosa, and tongue normal; teeth and gums normal  Neck:   Supple, symmetrical, trachea midline, no adenopathy;    thyroid:  no enlargement/tenderness/nodules; no JVD  Back:     Symmetric, no curvature, ROM normal, no CVA tenderness  Lungs:     Clear to auscultation bilaterally, respirations unlabored  Chest Wall:    No tenderness or deformity   Heart:    Normal heart rate. Normal rhythm. No murmurs, rubs, or gallops.   Breast Exam:    deferred  Abdomen:     Soft, non-tender, bowel sounds active all four quadrants,    no masses, no organomegaly  Pelvic:    deferred  Extremities:   All extremities are intact. No cyanosis or edema. Mobile and round mass at right wrist noted  Pulses:   2+ and symmetric all extremities  Skin:   Skin color, texture, turgor normal, no rashes or lesions  Lymph nodes:   Cervical and supraclavicular nodes normal  Neurologic:   CNII-XII grossly intact.      Last depression screening scores    12/12/2021    9:59 AM 10/22/2021   11:03 AM  PHQ 2/9 Scores  PHQ - 2 Score 0 0  PHQ- 9 Score 1 1   Last fall risk screening    12/12/2021    9:59 AM  Fall Risk   Falls in the past year? 0  Number falls in past yr: 0  Injury with Fall? 0  Risk for fall due to : No Fall Risks  Follow up Falls evaluation completed     No results found for any visits on 12/12/21.  Assessment & Plan    Routine Health Maintenance and Physical Exam  Exercise Activities and Dietary recommendations -Discussed heart  healthy diet low in fat and carbohydrates. Recommend moderate exercise 150 mins/wk.  Immunization History  Administered Date(s) Administered   Influenza,inj,Quad PF,6+ Mos 05/08/2017   Tdap 06/25/2017  Health Maintenance  Topic Date Due   COVID-19 Vaccine (1) Never done   INFLUENZA VACCINE  12/31/2021   PAP SMEAR-Modifier  11/27/2023   TETANUS/TDAP  06/26/2027   Hepatitis C Screening  Completed   HIV Screening  Completed   HPV VACCINES  Aged Out    Discussed health benefits of physical activity, and encouraged her to engage in regular exercise appropriate for her age and condition.  Problem List Items Addressed This Visit   None Visit Diagnoses     Healthcare maintenance    -  Primary   Hyperlipidemia, unspecified hyperlipidemia type          Discussed with patient most recent lab results which are essentially within normal limits with the exception of cholesterol panel. LDL is elevated. Discussed with patient a heart healthy diet low in fat and exercise. Will repeat lipid panel in 6 months.  The 10-year ASCVD risk score (Arnett DK, et al., 2019) is: 0.1% Followed by OB/GYN. UTD pap.  Discussed adequate hydration.  Discussed elevation, low sodium diet and elevation for intermittent lower extremity swelling.  Recommend to monitor small ganglion cyst of right wrist.  Return in about 6 months (around 06/14/2022) for HLD and repeat fasting lipid panel.       Lorrene Reid, PA-C  [facility] [phone] (phone) [phone] (fax)  [place]

## 2021-12-17 ENCOUNTER — Encounter: Payer: Medicaid Other | Admitting: Obstetrics & Gynecology

## 2022-06-16 ENCOUNTER — Ambulatory Visit: Payer: Medicaid Other | Admitting: Physician Assistant

## 2022-07-25 ENCOUNTER — Ambulatory Visit: Payer: Medicaid Other | Admitting: Family Medicine

## 2023-06-09 DIAGNOSIS — Z1159 Encounter for screening for other viral diseases: Secondary | ICD-10-CM | POA: Diagnosis not present

## 2023-06-09 DIAGNOSIS — Z13220 Encounter for screening for lipoid disorders: Secondary | ICD-10-CM | POA: Diagnosis not present

## 2023-06-09 DIAGNOSIS — J302 Other seasonal allergic rhinitis: Secondary | ICD-10-CM | POA: Diagnosis not present

## 2023-06-09 DIAGNOSIS — Z0000 Encounter for general adult medical examination without abnormal findings: Secondary | ICD-10-CM | POA: Diagnosis not present

## 2023-06-09 DIAGNOSIS — R233 Spontaneous ecchymoses: Secondary | ICD-10-CM | POA: Diagnosis not present

## 2023-06-09 DIAGNOSIS — Z1231 Encounter for screening mammogram for malignant neoplasm of breast: Secondary | ICD-10-CM | POA: Diagnosis not present

## 2023-06-09 DIAGNOSIS — Z114 Encounter for screening for human immunodeficiency virus [HIV]: Secondary | ICD-10-CM | POA: Diagnosis not present

## 2023-07-28 DIAGNOSIS — Z1231 Encounter for screening mammogram for malignant neoplasm of breast: Secondary | ICD-10-CM | POA: Diagnosis not present

## 2023-10-14 DIAGNOSIS — Z7185 Encounter for immunization safety counseling: Secondary | ICD-10-CM | POA: Diagnosis not present

## 2023-10-14 DIAGNOSIS — Z111 Encounter for screening for respiratory tuberculosis: Secondary | ICD-10-CM | POA: Diagnosis not present

## 2023-12-07 DIAGNOSIS — Z8619 Personal history of other infectious and parasitic diseases: Secondary | ICD-10-CM | POA: Diagnosis not present

## 2023-12-07 DIAGNOSIS — Z124 Encounter for screening for malignant neoplasm of cervix: Secondary | ICD-10-CM | POA: Diagnosis not present

## 2024-01-05 ENCOUNTER — Other Ambulatory Visit (HOSPITAL_COMMUNITY): Payer: Self-pay
